# Patient Record
Sex: Female | Born: 1960 | Race: White | Hispanic: No | Marital: Married | Smoking: Never smoker
Health system: Southern US, Community
[De-identification: ages and names within clinical notes are randomized; demographics above are authoritative.]

## PROBLEM LIST (undated history)

## (undated) DIAGNOSIS — J45990 Exercise induced bronchospasm: Secondary | ICD-10-CM

## (undated) DIAGNOSIS — J302 Other seasonal allergic rhinitis: Secondary | ICD-10-CM

## (undated) DIAGNOSIS — E039 Hypothyroidism, unspecified: Secondary | ICD-10-CM

## (undated) DIAGNOSIS — Z862 Personal history of diseases of the blood and blood-forming organs and certain disorders involving the immune mechanism: Secondary | ICD-10-CM

## (undated) DIAGNOSIS — Z973 Presence of spectacles and contact lenses: Secondary | ICD-10-CM

## (undated) HISTORY — PX: WISDOM TOOTH EXTRACTION: SHX21

## (undated) HISTORY — PX: COLONOSCOPY: SHX174

---

## 1998-03-16 ENCOUNTER — Other Ambulatory Visit: Admission: RE | Admit: 1998-03-16 | Discharge: 1998-03-16 | Payer: Self-pay | Admitting: *Deleted

## 1999-04-17 ENCOUNTER — Other Ambulatory Visit: Admission: RE | Admit: 1999-04-17 | Discharge: 1999-04-17 | Payer: Self-pay | Admitting: *Deleted

## 2000-05-27 ENCOUNTER — Other Ambulatory Visit: Admission: RE | Admit: 2000-05-27 | Discharge: 2000-05-27 | Payer: Self-pay | Admitting: *Deleted

## 2001-08-19 ENCOUNTER — Inpatient Hospital Stay (HOSPITAL_COMMUNITY): Admission: RE | Admit: 2001-08-19 | Discharge: 2001-08-22 | Payer: Self-pay | Admitting: Hematology and Oncology

## 2001-08-20 ENCOUNTER — Encounter: Payer: Self-pay | Admitting: Hematology and Oncology

## 2001-08-21 ENCOUNTER — Encounter: Payer: Self-pay | Admitting: Hematology and Oncology

## 2001-11-17 ENCOUNTER — Other Ambulatory Visit: Admission: RE | Admit: 2001-11-17 | Discharge: 2001-11-17 | Payer: Self-pay | Admitting: *Deleted

## 2002-10-30 ENCOUNTER — Emergency Department (HOSPITAL_COMMUNITY): Admission: EM | Admit: 2002-10-30 | Discharge: 2002-10-30 | Payer: Self-pay | Admitting: Emergency Medicine

## 2002-11-25 ENCOUNTER — Other Ambulatory Visit: Admission: RE | Admit: 2002-11-25 | Discharge: 2002-11-25 | Payer: Self-pay | Admitting: Obstetrics and Gynecology

## 2004-01-03 ENCOUNTER — Other Ambulatory Visit: Admission: RE | Admit: 2004-01-03 | Discharge: 2004-01-03 | Payer: Self-pay | Admitting: Obstetrics and Gynecology

## 2005-03-02 ENCOUNTER — Other Ambulatory Visit: Admission: RE | Admit: 2005-03-02 | Discharge: 2005-03-02 | Payer: Self-pay | Admitting: Obstetrics and Gynecology

## 2005-06-21 ENCOUNTER — Emergency Department (HOSPITAL_COMMUNITY): Admission: EM | Admit: 2005-06-21 | Discharge: 2005-06-21 | Payer: Self-pay | Admitting: Emergency Medicine

## 2005-06-25 ENCOUNTER — Ambulatory Visit (HOSPITAL_COMMUNITY): Admission: RE | Admit: 2005-06-25 | Discharge: 2005-06-25 | Payer: Self-pay | Admitting: Orthopedic Surgery

## 2005-12-24 DIAGNOSIS — Z862 Personal history of diseases of the blood and blood-forming organs and certain disorders involving the immune mechanism: Secondary | ICD-10-CM

## 2005-12-24 HISTORY — DX: Personal history of diseases of the blood and blood-forming organs and certain disorders involving the immune mechanism: Z86.2

## 2006-03-19 ENCOUNTER — Other Ambulatory Visit: Admission: RE | Admit: 2006-03-19 | Discharge: 2006-03-19 | Payer: Self-pay | Admitting: Obstetrics and Gynecology

## 2006-06-13 ENCOUNTER — Encounter: Admission: RE | Admit: 2006-06-13 | Discharge: 2006-07-23 | Payer: Self-pay | Admitting: Family Medicine

## 2006-12-24 HISTORY — PX: TOTAL THYROIDECTOMY: SHX2547

## 2007-02-10 ENCOUNTER — Encounter: Admission: RE | Admit: 2007-02-10 | Discharge: 2007-05-11 | Payer: Self-pay | Admitting: Family Medicine

## 2007-02-21 ENCOUNTER — Ambulatory Visit (HOSPITAL_COMMUNITY): Admission: RE | Admit: 2007-02-21 | Discharge: 2007-02-21 | Payer: Self-pay | Admitting: Family Medicine

## 2007-02-26 ENCOUNTER — Encounter (INDEPENDENT_AMBULATORY_CARE_PROVIDER_SITE_OTHER): Payer: Self-pay | Admitting: Specialist

## 2007-02-26 ENCOUNTER — Other Ambulatory Visit: Admission: RE | Admit: 2007-02-26 | Discharge: 2007-02-26 | Payer: Self-pay | Admitting: Interventional Radiology

## 2007-02-26 ENCOUNTER — Encounter: Admission: RE | Admit: 2007-02-26 | Discharge: 2007-02-26 | Payer: Self-pay | Admitting: Family Medicine

## 2007-05-01 ENCOUNTER — Encounter: Admission: RE | Admit: 2007-05-01 | Discharge: 2007-05-01 | Payer: Self-pay | Admitting: Obstetrics and Gynecology

## 2007-05-21 ENCOUNTER — Encounter (HOSPITAL_COMMUNITY): Admission: RE | Admit: 2007-05-21 | Discharge: 2007-05-21 | Payer: Self-pay | Admitting: Endocrinology

## 2007-08-14 ENCOUNTER — Encounter (INDEPENDENT_AMBULATORY_CARE_PROVIDER_SITE_OTHER): Payer: Self-pay | Admitting: Surgery

## 2007-08-14 ENCOUNTER — Ambulatory Visit (HOSPITAL_COMMUNITY): Admission: RE | Admit: 2007-08-14 | Discharge: 2007-08-15 | Payer: Self-pay | Admitting: Surgery

## 2007-12-29 ENCOUNTER — Encounter: Admission: RE | Admit: 2007-12-29 | Discharge: 2007-12-29 | Payer: Self-pay | Admitting: Obstetrics and Gynecology

## 2009-11-22 ENCOUNTER — Ambulatory Visit: Payer: Self-pay | Admitting: Sports Medicine

## 2009-11-22 DIAGNOSIS — M25569 Pain in unspecified knee: Secondary | ICD-10-CM | POA: Insufficient documentation

## 2009-11-22 DIAGNOSIS — M79609 Pain in unspecified limb: Secondary | ICD-10-CM | POA: Insufficient documentation

## 2009-11-22 DIAGNOSIS — M25559 Pain in unspecified hip: Secondary | ICD-10-CM | POA: Insufficient documentation

## 2010-08-24 ENCOUNTER — Ambulatory Visit: Payer: Self-pay | Admitting: Sports Medicine

## 2010-08-24 DIAGNOSIS — M7750 Other enthesopathy of unspecified foot: Secondary | ICD-10-CM | POA: Insufficient documentation

## 2010-08-24 DIAGNOSIS — M7660 Achilles tendinitis, unspecified leg: Secondary | ICD-10-CM | POA: Insufficient documentation

## 2010-09-21 ENCOUNTER — Ambulatory Visit: Payer: Self-pay | Admitting: Sports Medicine

## 2010-11-22 ENCOUNTER — Ambulatory Visit: Payer: Self-pay | Admitting: Sports Medicine

## 2010-11-22 DIAGNOSIS — Q667 Congenital pes cavus, unspecified foot: Secondary | ICD-10-CM | POA: Insufficient documentation

## 2010-12-12 ENCOUNTER — Ambulatory Visit: Payer: Self-pay | Admitting: Sports Medicine

## 2010-12-24 HISTORY — PX: ORIF WRIST FRACTURE: SHX2133

## 2010-12-24 HISTORY — PX: REPAIR PERONEAL TENDONS ANKLE: SUR1201

## 2011-01-14 ENCOUNTER — Encounter: Payer: Self-pay | Admitting: Family Medicine

## 2011-01-25 NOTE — Assessment & Plan Note (Signed)
Summary: F/U FEET,MC   Vital Signs:  Patient profile:   50 year old female Pulse rate:   76 / minute BP sitting:   123 / 86  (right arm)  Vitals Entered By: Rochele Pages RN (November 22, 2010 10:01 AM) CC: f/u foot pain   CC:  f/u foot pain.  History of Present Illness: Pt returns to clinic for f/u rt achilles tendonitis, and foot pain.   Achilles tendonitis is 70% improved- she stopped her rehab exercises and running 1 month ago d/t foot pain. Has not used ketoprofen in 1 month.  Feels some discomfort in achilles after running. Has new left lateral foot pain that started after a run where she landed unsteadily 3-4 times.  Also reports pain on the bottoms of her feet- worst at night.  Feels better with shoes on. Left heel pain is the worst. Was up to 20 miles per week before ankle injury last month.    Preventive Screening-Counseling & Management  Alcohol-Tobacco     Smoking Status: never  Allergies (verified): 1)  ! Amoxicillin  Social History: Smoking Status:  never  Physical Exam  General:  Well-developed,well-nourished,in no acute distress; alert,appropriate and cooperative throughout examination Msk:  RT ankle shows no swelling; stable lateral and medial ligaments; squeeze test and kleiger test unremarkable; talar dome seems nontender; no sign of peroneal tendon subluxations; no pain at base of 5th MT. Lt ankle is normal with exception of swelling around peroneal tendons coming into base of the 5th. This is mild   Strength of peroneals on left normal, calf normal. Bilateral cavus feet with supintaion on standing.  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)  RT appears healed  new injury to left that I think comes from running in dark and loss of proprioception  ankle exercises w emphasis on balance restart super feet  if still w  foot aching we need to move ahead to custom orthotics with her supination and cavus feet  reck in 4 to 6 wks if not improved  Orders: Theraband per yard [id number])  Problem # 2:  ACHILLES TENDINITIS (ICD-726.71) clinically this appears healed at this point  OK to resume easly ruinning and build back up if no probs  Problem # 3:  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 Aaa tid  Patient Instructions: 1)  Ok to gradually start running again 2)  Use superfeet in running shoes    Orders Added: 1)  Est. Patient Level III [84132] 2)  Theraband per yard [[id number]]

## 2011-01-25 NOTE — Assessment & Plan Note (Signed)
Summary: RT ANKLE INJURY X 1 MONTH,MC   Vital Signs:  Patient profile:   50 year old female Height:      62 inches Weight:      138 pounds BMI:     25.33 BP sitting:   133 / 78  Vitals Entered By: Darene Lamer MD (August 24, 2010 11:11 AM)  History of Present Illness: 50yo female to office w/ c/o R heel/achilles pain x 55-month. Running 18-miles/wk on road with many hills.  Currently increasing mileage about 10-15% a week.  Running with husband who is training for 1/2 marathon.   Recently changed shoes, but the more she ran had increasing pain in back of heel/ankle & at times in lateral & medial ankle. Some associated swelling. Denies any numbness/tingling. Not taking any meds for this. Using ice wraps after work-outs & occasionaly night time icing. Doing some rowing, elliptical, & wt training.  Job: Works at [employer] for [employer]  Hx of bad Rt ankle sprain several years ago that required use of a boot.  Review of Systems      See HPI  Physical Exam  General:  Well-developed,well-nourished,in no acute distress; alert,appropriate and cooperative throughout examination Msk:  ANKLE:  - Rt ankle: mild amount of lateral swelling noted over peroneal tendons.  Minimal tenderness with deep palpation over peroneal muscles posterior to lateral malleolus.  No TTP over medial or lateral malleolus, no TTP over base of 5th MT.  Normal ankle ROM without pain.  Strength +5/5.  Neg anterior drawer, neg talar tilt, neg Kleiger.  No peroneal subluxations. - Lt ankle with FROM without pain, swelling, tenderness, weakness, instability.  FOOT:  - Rt foot: no swelling noted.  (+)TTP at insertion of calcaneous, no palpable nodules or step offs.  Normal gastroc definition with neg Thompson test.  Able to heel raise.  No tenderness over PF.  Does have mild forefoot collapse.  Rt foot with slight pronation at rest. - Lt foot: no swelling, deformity, tenderness.  No significant arch  breakdown.    GAIT: has increased pronation of right foot with walking compared to left. Pulses:  2/4 DP & PT b/l Extremities:  no edema Neurologic:  sensation intact to light touch.   Additional Exam:  MSK U/S: Rt achilles with no signs of tear.  Increased fluid noted along calcaneous at insertion of achilles.  No increased blood flow noted with doppler.  Rt achilles thickness measuring 0.44cm.  Comparison to Lt achilles which measured 0.37cm.  Exam of Rt lateral ankle revealed fluid surrounding peroneal muscles at level of peroneal tubercle, best seen in transverse.  No signs of tear.  No increased blood flow on doppler.  Images saved.   Impression & Recommendations:  Problem # 1:  ACHILLES TENDINITIS (ICD-726.71) Assessment New  - Rt achilles tendinitis as noted on MSK u/s - Rx for Ketaprofen gel given - apply tid - Set of heel lifts given for shoes - Reviewed stretches & exercises - handout given for eccentric heel raises, toe walking, heel walking, & stretches.  Should do these exercises twice daily. - Ok to continue with running, should avoid hills.  If having persistant pain should cut back time/mileage. 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 - f/u 4-weeks for re-evaluation.  Orders: [place] LIMITED ([id number]) Foot Insert (Q5956)  Complete Medication List: 1)  Ketoprofen Gel 20%  .... Aaa tid  Patient Instructions: 1)  You have some achilles & peroneal tendonitis. 2)  Apply Ketoprofen gel to affected areas 3-times a day. 3)  Do stretches & exercises twice daily as shown in the office. 4)  Put heel lifts in shoes to ease pressure on achilles. 5)  Continue running, but should avoid hills  when possible. 6)  Follow-up in 72-month for re-evaluation. Prescriptions: KETOPROFEN GEL 20% aaa tid  #60 gms x 3   Entered by:   Lillia Pauls CMA   Authorized by:   Darene Lamer MD   Signed by:   Lillia Pauls CMA on 08/24/2010   Method used:   Print then Give to Patient   RxID:   [id number]

## 2011-01-25 NOTE — Assessment & Plan Note (Signed)
Summary: f/u,mc   Vital Signs:  Patient profile:   50 year old female Pulse rate:   73 / minute BP sitting:   120 / 76  (right arm) CC: f/u rt achilles/heel pain   CC:  f/u rt achilles/heel pain.  History of Present Illness: 50yo female for f/u of R achilles & R peroneal tendonitis. Achilles feel 75% better.  No pain with running.  No swelling. Peroneals feel about 50% better, still bothersome at times, but not while running.  No swelling, no bruising. Using ketoprofen as needed. Doing exercises at least once daily. Heel lifts feel good in her shoes. Running 15-18 miles/week at this time.  Review of Systems      See HPI  Physical Exam  General:  Well-developed,well-nourished,in no acute distress; alert,appropriate and cooperative throughout examination Msk:  ANKLE:  - Rt ankle: no swelling noted over over peroneal tendons today.  Minimal tenderness with deep palpation over insertion of peroneus brevis. No TTP posterior to lateral malleolus.  No TTP over medial malleolus.Normal ankle ROM without pain.  Strength +5/5.  Neg anterior drawer, neg talar tilt, neg Kleiger.  No peroneal subluxations. - Lt ankle with FROM without pain, swelling, tenderness, weakness, instability.  FOOT:  - Rt foot: no swelling noted.  Minimal tenderness of achilles at at insertion of calcaneous, no palpable nodules or step offs.  Normal gastroc definition with neg Thompson test.  Able to heel raise.  No tenderness over PF.  Mild forefoot collapse.  Rt foot with slight pronation at rest. - Lt foot: no swelling, deformity, tenderness.  No significant arch breakdown.    GAIT: has increased pronation of right foot with walking compared to left.  No limp  Neurovascularly intact   Impression & Recommendations:  Problem # 1:  ACHILLES TENDINITIS (ICD-726.71) Assessment Improved - 75% improved - Continue to wear heel lifts - should use these for at least 3 months - Continue achilles exercises daily -  Cont. Ketoprofen gel as needed - Should ice after workouts if having pain - Ok to continue running, may increase mileage 2-3 miles/wk as long as not having pain.  Start to add a hill workout once a week - 5-6 runs of 75-100 yds up a hill. 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 ketoprofen gel as needed - Ice after workouts if having pain - See running plan as stated above - f/u 6 weeks  Complete Medication List: 1)  Ketoprofen Gel 20%  .... Aaa tid  Patient Instructions: 1)  Continue to wear heel lifts, should wear for at least 58-months. 2)  Continue to use ketoprofen gel as needed. 3)  Continue achilles stretches and exercises daily. 4)  Continue running, may increase mileage 2-3 miles/wk if not having pain.  Start adding hill workout once a week - 5-6 runs of 75-100yds up a hill. 5)  Follow-up 6 weeks for re-evaluation.

## 2011-01-25 NOTE — Assessment & Plan Note (Signed)
Summary: TO SEE FIELDS & Karuna Balducci,ORTHOTICS,MC   Vital Signs:  Patient profile:   50 year old female BP sitting:   120 / 76  Vitals Entered By: Lillia Pauls CMA (December 12, 2010 3:21 PM)  CC:  f/u foot pain and custom orthotics.  History of Present Illness: 50yo female to office for custom orthotics. Achilles continue to feel good. Still having foot pain on bottoms of feet that is worst at end of the day.  Pain worse on left compared to right.  Denies numbness/tingling. Continues to have some left lateral ankle pain, but improved.  Is doing exercises as directed. Has tried using treadmill, but this has been painful.  Allergies: 1)  ! Amoxicillin PMH-FH-SH reviewed for relevance  Review of Systems      See HPI  Physical Exam  General:  Well-developed,well-nourished,in no acute distress; alert,appropriate and cooperative throughout examination Msk:  ANKLES: - RT ankle: no swelling; stable lateral and medial ligaments; squeeze test and kleiger test unremarkable; talar dome nontender; no peroneal tendon subluxations; no pain at base of 5th MT. - LT ankle: mildly TTP along insertion of peroneal tendons at base of the 5th, no swelling noted.  Normal peroneal strength.  No laxity.  No tenderness.  FEET: Bilateral cavus feet with supintaion while standing on standing.  GAIT: Running gait shows slight supination b/l.  No limp. Pulses:  +2/4 DP & PT b/l Neurologic:  sensation intact to light touch.     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 Patient was fitted for a : standard, cushioned, semi-rigid orthotic. The orthotic was heated and afterward the patient stood on the orthotic blank positioned on the orthotic stand. The patient was positioned in subtalar neutral position and 10 degrees of ankle dorsiflexion in a weight bearing stance. After completion of molding, a stable  base was applied to the orthotic blank. The blank was ground to a stable position for weight bearing. Size:  8 blue swirl Base: blue EVA foam Posting: none Additional orthotic padding:  none Comfortable to patient in office today. 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  Orders: [employer], each unit [phone])  Complete Medication List: 1)  Ketoprofen Gel 20%  .... Aaa tid   Orders Added: 1)  Est. Patient Level IV [60454] 2)  Orthotic Materials, each unit [L3002]

## 2011-02-15 ENCOUNTER — Ambulatory Visit (INDEPENDENT_AMBULATORY_CARE_PROVIDER_SITE_OTHER): Payer: Commercial Managed Care - PPO | Admitting: Sports Medicine

## 2011-02-15 ENCOUNTER — Encounter: Payer: Self-pay | Admitting: Sports Medicine

## 2011-02-15 DIAGNOSIS — M7750 Other enthesopathy of unspecified foot: Secondary | ICD-10-CM

## 2011-02-20 NOTE — Assessment & Plan Note (Signed)
Summary: L ANKLE F/U,MC   History of Present Illness: back in fall had started back running had another ankle sprain on left late sept and a couple of injuries since then has not gotten back to running icing no real exercises swells a lot after too much walking  RT Achilles hurt back in fall and improved  works a number of 12 hour shifts both feet ache at night  Allergies: 1)  ! Amoxicillin  Physical Exam  General:  Well-developed,well-nourished,in no acute distress; alert,appropriate and cooperative throughout examination Msk:  Left foot shows swelling post to lat malleolus  stable lateral and medial ligaments; squeeze test and kleiger test unremarkable; talar dome seems nontender; no sign of peroneal tendon subluxations; no pain at base of 5th MT.  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 Aaa tid 2)  Nitroglycerin 0.2 Mg/hr Pt24 (Nitroglycerin) .... 1/4 patch as directed x 24 hours per day Prescriptions: NITROGLYCERIN 0.2 MG/HR PT24 (NITROGLYCERIN) 1/4 patch as directed x 24 hours per day  #30 x 1   Entered and Authorized by:   Enid Baas MD   Signed by:   Enid Baas MD on 02/15/2011  Method used:   Electronically to        [facility] (retail)       [address]       [place], [place]  [id number]       Ph: [phone]       Fax: [phone]   RxID:   [id number]    Orders Added: 1)  Airsport brace [L1906] 2)  Est. Patient Level IV [36644] 3)  [place] LIMITED [03474]

## 2011-03-15 ENCOUNTER — Ambulatory Visit (INDEPENDENT_AMBULATORY_CARE_PROVIDER_SITE_OTHER): Payer: Commercial Managed Care - PPO | Admitting: Sports Medicine

## 2011-03-15 ENCOUNTER — Encounter: Payer: Self-pay | Admitting: Sports Medicine

## 2011-03-15 VITALS — BP 100/68 | Ht 62.0 in | Wt 150.0 lb

## 2011-03-15 DIAGNOSIS — M7750 Other enthesopathy of unspecified foot: Secondary | ICD-10-CM

## 2011-03-15 NOTE — Progress Notes (Signed)
  Subjective:    Patient ID: [name], female    DOB: 1961/04/23, 50 y.o.   MRN: [id number]  HPI 49yo female to office for f/u of left peroneal tendon tear.  Symptoms relatively unchanged from last visit.  Using NTG as directed, no adverse side effects, has not seen much improvement.  Continues to wear airsport brace.  Has stopped stationary bike & yoga b/c uncomfortable.  Continues to use custom orthotics which are comfortable.  Denies any numbness/tingling.    Review of Systems Per HPI, otherwise negative   BP 100/68  Ht 5\' 2"  (1.575 m)  Wt 150 lb (68.04 kg)  BMI 27.44 kg/m2  Objective:   Physical Exam GEN: AOx3, NAD MSK:  Left foot/ankle shows mild amount of swelling posterior to lateral malleolus.  TTP along peroneal tendons, no pain at base of 5th MT.  Good ROM of ankle, some pain with eversion of the foot.  Good strength in the ankle, mild pain with resisted eversion.  No TTP over achilles or PF.  Walking with antalgic gait.  Neurovascularly intact distally  MSK U/S:  Left ankle- the left peroneus brevis tendon has a persistent split just behind and below the lat malleolus, but appears to have decreased in size.  Seen both on long & short views.  This normalizes distal to malleolus.  Increased doppler flow noted, but less than previous visit.  Minimal fluid surrounding tendon sheath today.  Images saved.      Assessment & Plan:   Problem List as of 03/15/2011                     Other enthesopathy of ankle and tarsus   Last Assessment & Plan Note   03/15/2011 Office Visit Addendum 03/15/2011  6:23 PM by Claris Che, MD    - Improvement on MSK u/s today with decreased splitting & decreased neovessels - Cont. airsport brace - Cont. NTG - Should try to resume stationary bike if able. - f/u 4-weeks to reassess progress, call with any questions/concerns

## 2011-03-15 NOTE — Assessment & Plan Note (Addendum)
-   Improvement on MSK u/s today with decreased splitting & decreased neovessels - Cont. airsport brace - Cont. NTG - Should try to resume stationary bike if able. - f/u 4-weeks to reassess progress, call with any questions/concerns

## 2011-04-12 ENCOUNTER — Encounter: Payer: Self-pay | Admitting: Sports Medicine

## 2011-04-12 ENCOUNTER — Ambulatory Visit (INDEPENDENT_AMBULATORY_CARE_PROVIDER_SITE_OTHER): Payer: Commercial Managed Care - PPO | Admitting: Sports Medicine

## 2011-04-12 VITALS — BP 118/80

## 2011-04-12 DIAGNOSIS — M7750 Other enthesopathy of unspecified foot: Secondary | ICD-10-CM

## 2011-04-12 NOTE — Progress Notes (Signed)
  Subjective:    Patient ID: [name], female    DOB: 02-03-1961, 50 y.o.   MRN: [id number]  HPI 49yo female to office for f/u of left peroneal tendon tear. She feels 40% improved at this time. Notices decreased swelling. Is having some pain and soreness at the end of the day. Continues to wear her airsport brace at all times. Has been doing some light walking and occasional stationary bike. She continues to use nitroglycerin patch without any side effects. Denies any numbness or tingling.   Review of Systems    per HPI, otherwise negative Objective:   Physical Exam GEN: AOx3, NAD  MSK: Left foot/ankle shows no significant soft tissue swelling today.   Mild tenderness to palpation along the peroneal tendons just posterior to the lateral malleolus. Has mild pain with resisted ankle eversion, otherwise  Full-strength and full range of motion.  No TTP over achilles or PF.  Continues to walk with antalgic gait, although improved   neurovascularly intact distally.  MSK U/S: Left ankle- the left peroneus brevis tendon has a persistent split just behind and below the lat malleolus.   Splint appears to have decreased in size and has decreased surrounding edema. Noted to have increased Doppler flow in this area compared to previous scans. Overall it does appear improved. Images.        Assessment & Plan:

## 2011-04-12 NOTE — Assessment & Plan Note (Signed)
-   Persistent tearing of peroneal tendons noted on MSK ultrasound today, has noticeably increased Doppler flow - Cont. airsport brace  - Cont. NTG  -  okay to slowly start increasing activity such as walking and stationary bike as long as not having pain. - f/u 6-weeks to reassess progress, call with any questions/concerns

## 2011-04-16 ENCOUNTER — Encounter: Payer: Self-pay | Admitting: *Deleted

## 2011-04-26 ENCOUNTER — Telehealth: Payer: Self-pay | Admitting: *Deleted

## 2011-04-26 NOTE — Telephone Encounter (Signed)
The patient did not want to wait until May 31 appointment here and have to eventually go and see an orthopedic doctor.  she feels going to see an orthopedic specialist now might save some steps in the process.  I have scheduled the patient to see Dr. Marcene Corning for Wednesday, May 9 at 4 PM to have him evaluate and determine if surgery is needed. Their phone number is [phone]. I informed the patient of this appointment and she is in total agreement with it.

## 2011-05-08 NOTE — Op Note (Signed)
NAME:  [name], [name]                ACCOUNT NO.:  [id number]   MEDICAL RECORD NO.:  [id number]          PATIENT TYPE:  OIB   LOCATION:  1336                         FACILITY:  [facility]   PHYSICIAN:  Velora Heckler, MD      DATE OF BIRTH:  31-Aug-1961   DATE OF PROCEDURE:  08/14/2007  DATE OF DISCHARGE:                               OPERATIVE REPORT   PREOPERATIVE DIAGNOSIS:  Left thyroid nodule, hyperthyroidism.   POSTOPERATIVE DIAGNOSIS:  Left thyroid nodule, hyperthyroidism.   PROCEDURE:  Total thyroidectomy.   SURGEON:  Velora Heckler, MD, FACS   ASSISTANT:  Karie Soda, MD   ANESTHESIA:  General.   ESTIMATED BLOOD LOSS:  Minimal.   PREPARATION:  Betadine.   COMPLICATIONS:  None.   INDICATIONS:  The patient is 50 year old white female from [place],  [place].  She had a left thyroid nodule found is an incidental  finding during an MRI scan of the neck for cervical disk disease.  Workup included a thyroid ultrasound showing a heterogeneous thyroid  gland.  Fine-needle aspiration biopsy was performed and showed  follicular epithelium.  No atypia was identified.  Thyroid function  tests showed a low TSH level of 0.046.  The patient now comes to surgery  for thyroidectomy for management of thyroid nodule and hyperthyroidism.   BODY OF REPORT:  Procedure was done in OR #11 at the [facility].  The patient is brought to the operating room,  placed in supine position on the operating room table.  Following  administration of general anesthesia, the patient is positioned and then  prepped and draped in usual strict aseptic fashion.  After ascertaining  that an adequate level of anesthesia been obtained, a Kocher incision is  made with a #15 blade.  Dissection is carried through subcutaneous  tissues and platysma.  Hemostasis was obtained with electrocautery.  Skin flaps were elevated cephalad and caudad.  A [place]ner self-retaining  retractor was placed  for exposure.  Strap muscles were incised in the  midline and dissection is begun on the left side of the neck.  Left lobe  is exposed.  Lobe appears slightly firm consistent with underlying  thyroiditis.  There is a palpable nodule in the inferior pole.  Venous  tributaries were divided between small Ligaclips with the harmonic  scalpel.  Superior pole vessels were dissected out, ligated in  continuity with 2-0 silk ties, and divided with harmonic scalpel.  Superior parathyroid gland is identified and preserved on its vascular  pedicle.  Likewise an inferior parathyroid gland is identified and  preserved on its vascular pedicle.  Branches of the inferior thyroid  artery are divided between small Ligaclips with the harmonic scalpel  used for hemostasis.  Gland is rolled anteriorly and the ligament of  Allyson Sabal is transected with electrocautery.  Gland is excised off of the  anterior trachea and mobilized across the midline.  There is no  significant pyramidal lobe.   Next we turned our attention to the right side.  Again strap muscles  were reflected laterally and the right  thyroid lobe was exposed.  Superior pole vessels were dissected out, ligated in continuity with 2-0  silk ties, and divided with the harmonic scalpel.  Inferior venous  tributaries were divided between small Ligaclips with the harmonic  scalpel.  Gland is rolled anteriorly.  Again parathyroid tissue was  identified and preserved.  Recurrent nerve was identified and preserved.  Branches of the inferior thyroid artery are divided between small  Ligaclips with the harmonic scalpel.  Ligament of Allyson Sabal is transected  with electrocautery and the gland was further rolled anteriorly and  excised off the anterior trachea using the harmonic scalpel.  Sutures  used to mark the right superior pole of the gland and the gland is  submitted in its entirety to pathology for review.  Good hemostasis was  obtained in the neck.  Neck is  irrigated with warm saline.  Surgicel was  placed in the operative field.  Strap muscles were reapproximated in the  midline with interrupted 3-0 Vicryl sutures.  Platysma was closed with  interrupted 3-0 Vicryl sutures.  Skin was closed with running 4-0  Monocryl subcuticular suture.  Wound is washed and dried.  Benzoin,  Steri-Strips are applied.  Sterile dressings were applied.  The patient  is awakened from anesthesia and brought to the recovery room in stable  condition.  The patient tolerated the procedure well.      Velora Heckler, MD  Electronically Signed     TMG/MEDQ  D:  08/14/2007  T:  08/15/2007  Job:  [id number]   cc:   Dorisann Frames, M.D.  Fax: [phone]   Pam Drown, M.D.  Fax: [phone]

## 2011-05-11 NOTE — Discharge Summary (Signed)
[place]. [facility]  Patient:    [name], [name] Visit Number: [id number] MRN: [id number]          Service Type: MED Location: [id number] Attending Physician:  Janan Halter Dictated by:   Lowell C. Catha Gosselin, M.D. Admit Date:  08/19/2001 Discharge Date: 08/22/2001   CC:         Frederick A. Worthy Rancher, M.D.  Rockey Situ. Flavia Shipper., M.D.   Discharge Summary  DISCHARGE DIAGNOSIS:  Severe acute idiopathic thrombocytopenic purpura, improved.  HISTORY OF PRESENT ILLNESS:  The patient is a 50 year old female patient who was admitted with petechiae and a platelet count of essentially 0.  She had not had any bleeding prior to that but had noticed petechiae developing over the last couple of days prior to admission.  We subsequently admitted her due to her severely low platelet count for subsequent therapy.  Her past medical history had been otherwise not significant.  On physical exam temperature was 101.2 degrees, weight 148 pounds.  Vital signs were stable. Oral mucosa was within normal limits.  She did have multiple petechiae on her legs.  Abdomen was benign, without hepatosplenomegaly.  HOSPITAL COURSE:  The patient subsequently was seen by infectious disease due to the spectrum of symptoms that she had.  They suggested that CMV serologies be done, which are pending.  HIV serology was done and was negative.  They did not feel this represented [place] Nile virus.  Her admission laboratories here showed a white count of 8.9, hemoglobin 13.5, platelet count less than 5000. Sodium 139, creatinine 0.7.  Fibrinogen 367.  PT 13.4 seconds, PTT of 29 seconds, with no schistocytes on smear.  We subsequently started her on high-dose IV steroids and IV Amicar.  Her petechiae quickly started to fade.  She had a little gum bleeding on August 20, 2001.  She had a CT scan of the brain done on August 20, 2001, and had a slight dye reaction.  However, the CT did not  show any intracranial bleeding and was essentially normal.  We did an ultrasound of the abdomen and looked at her abdomen, and her spleen size was normal.  By August 21, 2001, her platelet count was 10,000.  By August 22, 2001, it was 12,000 with a white count of 8.4 and hemoglobin 11.5.  RBC folate level was 322, ferritin level was 62, B12 level was 1579.  We subsequently felt by that point in time that she could go home on p.o. medications.  DISCHARGE MEDICATIONS:  Prednisone 60 mg p.o. q.d.  FOLLOW-UP:  She was to follow up with me within a week.  ACTIVITY:  As tolerated.  DIET:  As tolerated.  DISCHARGE INSTRUCTIONS:  She was not to take aspirin or aspirin products.  CONDITION ON DISCHARGE:  At the time of discharge the overall status was improved.  PROGNOSIS:  Guarded. Dictated by:   Lowell C. Catha Gosselin, M.D. Attending Physician:  Janan Halter DD:  08/29/01 TD:  08/31/01 Job: [id number]

## 2011-05-11 NOTE — H&P (Signed)
[place]  Patient:    [name], [name] Visit Number: [id number] MRN: [id number]          Service Type: MED Location: [phone] [id number] Attending Physician:  Janan Halter Dictated by:   Lowell C. Catha Gosselin, M.D. Adm. Date:  08/19/2001   CC:         Frederick A. Worthy Rancher, M.D.  Dr. Burnice Logan, infectious disease.   History and Physical  DATE OF BIRTH:  06-Mar-1961  HISTORY OF PRESENT ILLNESS:  The patient is admitted today.  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 She actually has been feeling fairly well until having kind of a dull headache the last day or so. She also developed some petechiae on her lower extremities over the last couple of days. She went to Dr. Terri Piedra, who, today, drew a CBC based on the appearance of her rash from yesterday. Her creatinine was 0.9, BUN 13. White blood count 6.8, hemoglobin 13.7, MCV 86. The platelet count was essentially 0. ANA was done, but has not returned.  She really has not had any oral or nasal bleeding. No GI or GU bleeding. She had a fairly recent menstrual period that was actually shorter rather longer than normal. She is not having any vaginal spotting now. Her husband had a vasectomy so she does not feel as though she is probably pregnant.  Her last dental work was done two to three months ago and was uneventful.  ALLERGIES:  AMOXICILLIN, SHE HAS A RASH TO IT.  CURRENT MEDICATIONS AT HOME:  Glucosamine over-the-counter.  FAMILY HISTORY:  Negative for any blood disorders or cancer disorders that she knows of.  SOCIAL HISTORY:  She does not smoke. She has an occasional alcoholic beverage. She lives in [place]. She has two children. She is married and her husband comes with her today.  REVIEW OF SYSTEMS:  Otherwise, unremarkable. No B symptoms. She does not recall being bitten by any type of insect or  spider.  PHYSICAL EXAMINATION:  VITAL SIGNS:  Temperature 101.2. Weight 148 pounds. She is 5 feet 2 inches tall. Blood pressure 123/79, pulse 104, respiratory rate 20.  HEENT:  Oral mucosa is within normal limits.  NECK:  No cervical, supraclavicular or axillary adenopathy.  HEART:  S1 and S2 without rubs or gallops.  LUNGS:  Fairly clear.  ABDOMEN:  Benign without hepatosplenomegaly. Bowel sounds active. No inguinal adenopathy.  EXTREMITIES:  She does have petechiae on her legs. None on her hard or soft palate.  LABORATORY DATA:  Studies are being repeated in the hospital.  ASSESSMENT:  Severe thrombocytopenia. I told the patient I do not think this is leukemia since she has a normal hemoglobin and a normal white blood count. I do not think it is probably any of the current syndromes including West Nile virus, which everyone is concerned about, because that does not specifically cause a thrombocytopenia. I suspect this is a viral syndrome with secondary idiopathic thrombocytopenic purpura.  PLAN:  We are going to go ahead and admit her to the hospital and start her on some IV steroid therapy. If she fails that we can always do IBIG, but we would like to see her platelet count coming up prior to going home. We talked about splenectomy, as well, should she fail those other two modalities. For now I am going to hold off on a bone marrow test to  see if, indeed, treatment for ITP does not improve this fairly soon. Dictated by:   Lowell C. Catha Gosselin, M.D. Attending Physician:  Janan Halter DD:  08/19/01 TD:  08/19/01 Job: [id number]

## 2011-05-24 ENCOUNTER — Ambulatory Visit (INDEPENDENT_AMBULATORY_CARE_PROVIDER_SITE_OTHER): Payer: Commercial Managed Care - PPO | Admitting: Sports Medicine

## 2011-05-24 VITALS — BP 116/82

## 2011-05-24 DIAGNOSIS — M7750 Other enthesopathy of unspecified foot: Secondary | ICD-10-CM

## 2011-05-24 NOTE — Patient Instructions (Signed)
Use theraband for light ankle range of motion Balance on left foot  Ok to start walking short distances at a time Return for re-scan in 6 weeks

## 2011-05-24 NOTE — Progress Notes (Signed)
  Subjective:    Patient ID: [name], female    DOB: May 22, 1961, 50 y.o.   MRN: [id number]  HPI  Pt presents to clinic for f/u of peroneal tendon tear which she states is slightly less painful but still symptoms returned when she stands or spends too much time at work. The rigid ankle brace is also getting uncomfortable after using this for a significant period of time. .  She is interested in discussing surgery if u/s does not show improvement of tear.  Had shot of cortisone done by Dr. Yisroel Ramming while dr fields was away- only helped 24 hours by the next day pain was the same.  Increased walking causes significant pain. Able to do easy ROM without problems.    She feels that if she is showing some improvement on ultrasound she may want to continue treatment but if not we'll go ahead with the surgical evaluation. Review of Systems     Objective:   Physical Exam No acute distress    Lt ankle exam- No obvious swelling Ligaments stable in inversion and eversion Dorsi and plantar flexion strong Inversion and eversion strong Slight pain with eversion Strength on eversion testing seems normal  Musculoskeletal ultrasound Compared to last visit there are signs of improvement. There is less hypoechoic change around the tendon sheaths themselves. I still visualize an area just inferior to the tip of the malleolus in which it appears on both the longitudinal and transverse scan there is a partial split of the peroneal brevis tendon. This looks somewhat less prominent than on last scan. The biggest change is that on last examination she had marked increased Doppler activity with neo-vessels that crossed the entire tendon. On today's scan the Doppler activity is normal and there are minimal neo-vessels present in this area.   Assessment & Plan:

## 2011-05-24 NOTE — Assessment & Plan Note (Signed)
With the improvement seen on ultrasound I suggested that we be a bit more aggressive in her activity and rehabilitation. We switched her to an ASO brace. We gave her Theraband to start some easy range of motion and light resistance exercise as well as 1 foot balance exercises.  Since the studies of Achilles tendon showed that the first sign of healing was a change in Doppler activity for that tendon, I hope that this is a sign of healing of the peroneal tendons. I did suggest continued another 6 weeks of nitroglycerin therapy and rescanning at the end of that time. If she worsens or fails to improve at that point we will get her back to Dr. Jerl Santos for surgical consideration. Carbon copy this note to Dr. Jerl Santos.

## 2011-06-20 ENCOUNTER — Other Ambulatory Visit: Payer: Self-pay | Admitting: *Deleted

## 2011-06-20 MED ORDER — MELOXICAM 15 MG PO TABS: 15.0000 mg | ORAL_TABLET | Freq: Every day | ORAL | Status: AC | PRN

## 2011-06-20 NOTE — Progress Notes (Signed)
Pt called stating she was increasing activity since last visit including walking, using theraband and balancing exercises.  She was having soreness with this increased activity, but not significant pain.  Over the weekend she helped a neighbor move, and by the end of the day was having so much pain that it was painful to walk.  Advil not helpful.  Spoke with Dr. Darrick Penna- he states lifting aggravated the ankle and pt should rest the ankle over the next 3-4 days and ice as much as possible.  If not improving in 3-4 days she should return for evaluation.  Per Dr. Darrick Penna meloxicam rx'd for pt.

## 2011-06-22 ENCOUNTER — Encounter: Payer: Self-pay | Admitting: Sports Medicine

## 2011-06-22 ENCOUNTER — Other Ambulatory Visit: Payer: Self-pay | Admitting: *Deleted

## 2011-06-22 DIAGNOSIS — M7750 Other enthesopathy of unspecified foot: Secondary | ICD-10-CM

## 2011-07-05 ENCOUNTER — Ambulatory Visit: Payer: Commercial Managed Care - PPO | Admitting: Sports Medicine

## 2011-07-05 NOTE — Progress Notes (Unsigned)
  Subjective:    Patient ID: [name], female    DOB: 10-29-1961, 50 y.o.   MRN: [id number]  HPI    Review of Systems     Objective:   Physical Exam        Assessment & Plan:

## 2011-07-10 ENCOUNTER — Ambulatory Visit (HOSPITAL_BASED_OUTPATIENT_CLINIC_OR_DEPARTMENT_OTHER)
Admission: RE | Admit: 2011-07-10 | Discharge: 2011-07-10 | Disposition: A | Discharge status: Home / Self Care | Payer: Commercial Managed Care - PPO | Source: Ambulatory Visit | Attending: Orthopaedic Surgery | Admitting: Orthopaedic Surgery

## 2011-07-10 DIAGNOSIS — M24176 Other articular cartilage disorders, unspecified foot: Secondary | ICD-10-CM | POA: Insufficient documentation

## 2011-07-10 DIAGNOSIS — E669 Obesity, unspecified: Secondary | ICD-10-CM | POA: Insufficient documentation

## 2011-07-10 DIAGNOSIS — K219 Gastro-esophageal reflux disease without esophagitis: Secondary | ICD-10-CM | POA: Insufficient documentation

## 2011-07-10 DIAGNOSIS — Z01812 Encounter for preprocedural laboratory examination: Secondary | ICD-10-CM | POA: Insufficient documentation

## 2011-07-10 DIAGNOSIS — M249 Joint derangement, unspecified: Secondary | ICD-10-CM | POA: Insufficient documentation

## 2011-07-10 DIAGNOSIS — E039 Hypothyroidism, unspecified: Secondary | ICD-10-CM | POA: Insufficient documentation

## 2011-07-17 ENCOUNTER — Emergency Department (HOSPITAL_COMMUNITY)
Admission: EM | Admit: 2011-07-17 | Discharge: 2011-07-17 | Disposition: A | Discharge status: Home / Self Care | Payer: Commercial Managed Care - PPO | Attending: Emergency Medicine | Admitting: Emergency Medicine

## 2011-07-17 ENCOUNTER — Emergency Department (HOSPITAL_COMMUNITY): Payer: Commercial Managed Care - PPO

## 2011-07-17 DIAGNOSIS — Z9889 Other specified postprocedural states: Secondary | ICD-10-CM | POA: Insufficient documentation

## 2011-07-17 DIAGNOSIS — W010XXA Fall on same level from slipping, tripping and stumbling without subsequent striking against object, initial encounter: Secondary | ICD-10-CM | POA: Insufficient documentation

## 2011-07-17 DIAGNOSIS — E039 Hypothyroidism, unspecified: Secondary | ICD-10-CM | POA: Insufficient documentation

## 2011-07-17 DIAGNOSIS — S5290XA Unspecified fracture of unspecified forearm, initial encounter for closed fracture: Secondary | ICD-10-CM | POA: Insufficient documentation

## 2011-07-19 ENCOUNTER — Ambulatory Visit (HOSPITAL_BASED_OUTPATIENT_CLINIC_OR_DEPARTMENT_OTHER)
Admission: RE | Admit: 2011-07-19 | Discharge: 2011-07-19 | Disposition: A | Discharge status: Home / Self Care | Payer: Commercial Managed Care - PPO | Source: Ambulatory Visit | Attending: Orthopaedic Surgery | Admitting: Orthopaedic Surgery

## 2011-07-19 DIAGNOSIS — S52599A Other fractures of lower end of unspecified radius, initial encounter for closed fracture: Secondary | ICD-10-CM | POA: Insufficient documentation

## 2011-07-19 DIAGNOSIS — W19XXXA Unspecified fall, initial encounter: Secondary | ICD-10-CM | POA: Insufficient documentation

## 2011-07-19 DIAGNOSIS — Z01812 Encounter for preprocedural laboratory examination: Secondary | ICD-10-CM | POA: Insufficient documentation

## 2011-07-19 LAB — POCT HEMOGLOBIN-HEMACUE: Hemoglobin: 11.5 g/dL — ABNORMAL LOW (ref 12.0–15.0)

## 2011-08-08 NOTE — Op Note (Signed)
[name], [name]                ACCOUNT NO.:  [id number]  MEDICAL RECORD NO.:  [id number]  LOCATION:                                 FACILITY:  PHYSICIAN:  Lubertha Basque. Naija Troost, M.D.DATE OF BIRTH:  September 24, 1961  DATE OF PROCEDURE:  07/19/2011 DATE OF DISCHARGE:                              OPERATIVE REPORT   PREOPERATIVE DIAGNOSIS:  Right distal radius fracture.  POSTOPERATIVE DIAGNOSIS:  Right distal radius fracture.  PROCEDURE:  ORIF, right distal radius fracture.  ANESTHESIA:  General.  ATTENDING SURGEON:  Lubertha Basque. Jerl Santos, MD  ASSISTANT:  Lindwood Qua, PA   INDICATIONS FOR PROCEDURE:  The patient is a 50 year old woman who fell and broke her wrist a few days ago.  She is actually recuperating from some ankle surgery done last week.  She presented to the ER, then to the office with displaced intraarticular fracture, and is offered ORIF in hopes of realigning her joint.  Informed operative consent was obtained after discussion of possible complications including reaction to anesthesia, infection, neurovascular injury, and stiffness.  SUMMARY FINDINGS AND PROCEDURE:  Under general anesthesia through a volar approach, a right distal radius fracture was exposed, reduced, and stabilized with a short standard DVR plate.  I used fluoroscopy throughout the case to make appropriate intraoperative decisions and read all these views myself.  Bryna Colander assisted throughout and was invaluable to the completion of the case in that he helped maintain reduction while I performed the procedure.  He also closed simultaneously to help minimize OR time.  DESCRIPTION OF PROCEDURE:  The patient was taken to the operating suite where general anesthetic was applied without difficulty.  She was positioned in supine and prepped and draped in normal sterile fashion. After administration of IV Kefzol, the arm was elevated, exsanguinated, and tourniquet applied about the upper arm.  A  volar incision was made with dissection down to the FCR tendon.  We took this in a radial direction, went through the base of this tendon bed.  The short muscles were reflected off the radius.  The fracture site was well visualized. Reduction was performed of all the fragments under fluoroscopic guidance.  We then placed a short standard DVR plate utilizing all the screw holes.  I used fluoroscopy several times during application of plate to make sure that was in the proper position.  The wound was irrigated followed by reapproximation of deep tissues with 2-0 undyed Vicryl.  Skin was closed.  The tourniquet was deflated and fingers became pink and warm immediately.  I then closed skin with nylon.  Some Marcaine was injected followed by Adaptic, dry gauze, and a volar splint of plaster with wrist in slight extension.  We also removed the splint from her left leg and removed the sutures there and placed some Steri- Strips.  The __________ was reapplied.  Estimated blood loss and fluids obtained from anesthesia records as can accurate tourniquet time.  DISPOSITION:  The patient was extubated in the operating room and taken to recovery room in stable addition.  She was to go home the same day and follow up in the office in less than a week.  I will contact  her by phone tonight.     Lubertha Basque Jerl Santos, M.D.     PGD/MEDQ  D:  07/19/2011  T:  07/20/2011  Job:  [id number]  Electronically Signed by Marcene Corning M.D. on 08/08/2011 08:44:27 PM

## 2011-08-08 NOTE — Op Note (Signed)
[name], [name]                ACCOUNT NO.:  [id number]  MEDICAL RECORD NO.:  [id number]  LOCATION:                                 FACILITY:  PHYSICIAN:  Lubertha Basque. Jerl Santos, M.D.     DATE OF BIRTH:  DATE OF PROCEDURE:  07/10/2011 DATE OF DISCHARGE:                              OPERATIVE REPORT   PREOPERATIVE DIAGNOSIS:  Left ankle peroneal tendon tear.  POSTOPERATIVE DIAGNOSIS:  Left ankle peroneal tendon tear.  PROCEDURE:  Left ankle peroneal tendon repair.  ANESTHESIA:  General and block.  ATTENDING SURGEON:  Lubertha Basque. Jerl Santos, MD  ASSISTANT:  Lindwood Qua, PA   INDICATIONS FOR PROCEDURE:  The patient is a 50 year old woman with an extensive history of lateral aspect left ankle pain.  This has persisted despite oral anti-inflammatories and rest as well as physical therapy. An injection along the peroneal tendon sheath did afford her transient relief.  By ultrasound study, she has a split tear of the peroneus brevis.  At this point, she is offered exploration of the peroneal tendons with repair versus transfer.  Informed operative consent was obtained after discussion of possible complications including reaction to anesthesia, infection, neurovascular injury, and peroneal tendon dislocation.  SUMMARY OF FINDINGS AND PROCEDURE:  Under general anesthesia and a block through a posterolateral incision, the peroneal tendons were examined. The peroneus brevis did have a split tear consistent with her ultrasound study.  This extended from the inferior aspect of the lateral malleolus about 5 or 6 cm proximally.  The tissues were fairly good and we elected to excise some devitalized tissues and then perform a repair rather than a transfer of the tendon.  This was done and she was closed primarily and discharged home.  DESCRIPTION OF PROCEDURE:  The patient was taken to the operating suite where general anesthetic was applied without difficulty.  She was also given a  block in the preanesthesia area.  She was positioned in supine with a bump under her left hip.  She was then prepped and draped in normal sterile fashion.  After administration of IV Kefzol, the left leg was elevated, exsanguinated, and tourniquet inflated about the upper calf.  A posterolateral incision was made just behind the lateral malleolus.  Dissection was carried down to the peroneal tendon sheath. The sheath was incised longitudinally along with the retinaculum.  The tendons were dislocated.  The peroneus longus appeared completely benign while the brevis did have a split tear as listed above.  We removed some devitalized tissue sharply with a knife and debulked the tendon somewhat.  We then repaired circumferentially with 3-0 Vicryl in a running fashion both anterior and posterior on the tendon.  This seemed to give [place] a nice repair.  The tendons were placed back into their appropriate positions.  The wound was irrigated followed by release of tourniquet.  A small amount of bleeding was easily controlled with some pressure.  We then repaired the retinaculum and some other portions of the peroneal tendon sheath with 2-0 undyed Vicryl in an interrupted fashion.  The tendons actually stayed behind the lateral malleolus even before this repair and certainly also stayed after the repair.  We then repaired the subcutaneous tissues with 2-0 undyed Vicryl and skin with nylon.  Adaptic was placed over the wound followed by dry gauze and a posterior splint of plaster with an ankle in roughly neutral position. Estimated blood loss and intraoperative fluids obtained from anesthesia records as can accurate tourniquet time.  DISPOSITION:  The patient was extubated in the operating room and taken to the recovery in stable addition.  She was to go home same-day and follow up in the office next week.  I will contact her by phone tonight.     Lubertha Basque Jerl Santos, M.D.     PGD/MEDQ  D:   07/10/2011  T:  07/11/2011  Job:  [id number]  Electronically Signed by Marcene Corning M.D. on 08/08/2011 08:43:38 PM

## 2011-09-07 ENCOUNTER — Other Ambulatory Visit: Payer: Self-pay | Admitting: Obstetrics and Gynecology

## 2011-09-07 DIAGNOSIS — R928 Other abnormal and inconclusive findings on diagnostic imaging of breast: Secondary | ICD-10-CM

## 2011-09-17 ENCOUNTER — Ambulatory Visit
Admission: RE | Admit: 2011-09-17 | Discharge: 2011-09-17 | Disposition: A | Discharge status: Home / Self Care | Payer: BC Managed Care – PPO | Source: Ambulatory Visit | Attending: Obstetrics and Gynecology | Admitting: Obstetrics and Gynecology

## 2011-09-17 DIAGNOSIS — R928 Other abnormal and inconclusive findings on diagnostic imaging of breast: Secondary | ICD-10-CM

## 2011-10-05 LAB — URINALYSIS, ROUTINE W REFLEX MICROSCOPIC
Bilirubin Urine: NEGATIVE
Hgb urine dipstick: NEGATIVE
Specific Gravity, Urine: 1.008
pH: 6

## 2011-10-05 LAB — BASIC METABOLIC PANEL
BUN: 11
Chloride: 110
Glucose, Bld: 96
Potassium: 4.3
Sodium: 143

## 2011-10-05 LAB — DIFFERENTIAL
Basophils Absolute: 0
Eosinophils Relative: 2
Lymphocytes Relative: 15
Monocytes Absolute: 0.4
Monocytes Relative: 6
Neutro Abs: 5.4

## 2011-10-05 LAB — CALCIUM
Calcium: 8.4
Calcium: 8.4

## 2011-10-05 LAB — CBC
HCT: 40.1
Hemoglobin: 13.8
MCHC: 34.5
RBC: 4.46
RDW: 13.3

## 2012-01-07 ENCOUNTER — Ambulatory Visit: Payer: BC Managed Care – PPO | Admitting: Sports Medicine

## 2012-01-09 ENCOUNTER — Ambulatory Visit (INDEPENDENT_AMBULATORY_CARE_PROVIDER_SITE_OTHER): Payer: BC Managed Care – PPO | Admitting: Sports Medicine

## 2012-01-09 VITALS — BP 120/74

## 2012-01-09 DIAGNOSIS — M7742 Metatarsalgia, left foot: Secondary | ICD-10-CM | POA: Insufficient documentation

## 2012-01-09 DIAGNOSIS — M7750 Other enthesopathy of unspecified foot: Secondary | ICD-10-CM

## 2012-01-09 NOTE — Patient Instructions (Signed)
Great to see you [name],  Balance drills on one foot. Come back to see [place] as needed!   [name]. Benjamin Stain, M.D. [facility] [address] [place] [address] [phone]

## 2012-01-09 NOTE — Assessment & Plan Note (Signed)
Breakdown trans arch. MT cookies b/l B/l lateral posting. RTC prn

## 2012-01-09 NOTE — Progress Notes (Signed)
  Subjective:    Patient ID: [name], female    DOB: Apr 24, 1961, 51 y.o.   MRN: [id number]  HPI [name] comes to see [place] after having repair of her peroneal tendons of the left. She comes in with complaints of pain on the toes in her left foot as well as under the metatarsal heads. She does do a lot of time on her feet, and has been wearing her orthotics. She also occasionally gets pain along the lateral aspect of both feet. She recently went to see her therapist and podiatrist, and they both told her that she over supinates.   Review of Systems    No fevers, chills, night sweats, weight loss, chest pain, or shortness of breath.  Objective:   Physical Exam  General:  Well developed, well nourished, and in no acute distress. Neuro:  Alert and oriented x3, extra-ocular muscles intact. Skin: Warm and dry, no rashes noted. Musculoskeletal: Breakdown of the transverse arch bilaterally. Cavus foot bilaterally. Tender to palpation under the metatarsal heads, with abnormal callus along the second through fourth metatarsal heads particularly on the left side. Well-healed surgical scar along the left ankle.     Assessment & Plan:   Foot pain: Symptoms suggestive of metatarsalgia. Metatarsal cookies placed bilaterally in her orthotics, lateral post also placed. These are comfortable to her. She will come back to see [place] on an as-needed basis.

## 2012-07-06 ENCOUNTER — Ambulatory Visit: Payer: BC Managed Care – PPO

## 2012-07-06 ENCOUNTER — Ambulatory Visit: Payer: BC Managed Care – PPO | Admitting: Family Medicine

## 2012-07-06 VITALS — BP 115/74 | HR 81 | Temp 98.7°F | Resp 18 | Ht 61.5 in | Wt 167.0 lb

## 2012-07-06 DIAGNOSIS — M79676 Pain in unspecified toe(s): Secondary | ICD-10-CM

## 2012-07-06 DIAGNOSIS — M79609 Pain in unspecified limb: Secondary | ICD-10-CM

## 2012-07-06 NOTE — Progress Notes (Signed)
  Subjective:    Patient ID: [name], female    DOB: Jan 11, 1961, 51 y.o.   MRN: [id number]  HPI [name] is a 51 y.o. female Stubbed 4th toe on ottoman yesterday.  Sore swollen, black and blue yesterday.    Tx: ice, advil, elevation.   Review of Systems     Objective:   Physical Exam  Constitutional: She appears well-developed and well-nourished.  HENT:  Head: Normocephalic and atraumatic.  Cardiovascular: Normal rate and regular rhythm.   Pulmonary/Chest: Effort normal.  Musculoskeletal:       Right foot: She exhibits tenderness and swelling.       Feet:  Neurological: She is alert. No sensory deficit.  Psychiatric: She has a normal mood and affect. Her behavior is normal.    [facility] reading (PRIMARY) by  Dr. Neva Seat: R 4th toe. Small nondisplaced fx at base of R 4th proximal phalynx.      Assessment & Plan:  [name] is a 51 y.o. female 1. Toe pain  DG Toe 4th Right   Suspected nondisplaced R 4th toe fx.  Buddy tape, hard soled shoe for next 3-4 weeks.  Cross training ok, but would avoid running for now.   rtc precautions.

## 2012-07-06 NOTE — Patient Instructions (Signed)
Buddy tape and wear hard soled shoe for next 3-4 weeks.   Recheck if any worsening. Toe Fracture Your caregiver has diagnosed you as having a fractured toe. A toe fracture is a break in the bone of a toe. "Buddy taping" is a way of splinting your broken toe, by taping the broken toe to the toe next to it. This "buddy taping" will keep the injured toe from moving beyond normal range of motion. Buddy taping also helps the toe heal in a more normal alignment. It may take 6 to 8 weeks for the toe injury to heal. HOME CARE INSTRUCTIONS   Leave your toes taped together for as long as directed by your caregiver or until you see a doctor for a follow-up examination. You can change the tape after bathing. Always use a small piece of gauze or cotton between the toes when taping them together. This will help the skin stay dry and prevent infection.   Apply ice to the injury for 15 to 20 minutes each hour while awake for the first 2 days. Put the ice in a plastic bag and place a towel between the bag of ice and your skin.   After the first 2 days, apply heat to the injured area. Use heat for the next 2 to 3 days. Place a heating pad on the foot or soak the foot in warm water as directed by your caregiver.   Keep your foot elevated as much as possible to lessen swelling.   Wear sturdy, supportive shoes. The shoes should not pinch the toes or fit tightly against the toes.   Your caregiver may prescribe a rigid shoe if your foot is very swollen.   Your may be given crutches if the pain is too great and it hurts too much to walk.   Only take over-the-counter or prescription medicines for pain, discomfort, or fever as directed by your caregiver.   If your caregiver has given you a follow-up appointment, it is very important to keep that appointment. Not keeping the appointment could result in a chronic or permanent injury, pain, and disability. If there is any problem keeping the appointment, you must call back  to this facility for assistance.  SEEK MEDICAL CARE IF:   You have increased pain or swelling, not relieved with medications.   The pain does not get better after 1 week.   Your injured toe is cold when the others are warm.  SEEK IMMEDIATE MEDICAL CARE IF:   The toe becomes cold, numb, or white.   The toe becomes hot (inflamed) and red.  Document Released: 12/07/2000 Document Revised: 11/29/2011 Document Reviewed: 07/26/2008 [facility] Patient Information 2012 [place], [place].

## 2012-09-30 ENCOUNTER — Other Ambulatory Visit: Payer: Self-pay | Admitting: Sports Medicine

## 2012-10-02 ENCOUNTER — Other Ambulatory Visit: Payer: Self-pay | Admitting: Obstetrics and Gynecology

## 2013-03-26 ENCOUNTER — Other Ambulatory Visit: Payer: Self-pay | Admitting: Occupational Medicine

## 2013-03-26 ENCOUNTER — Ambulatory Visit: Payer: Self-pay

## 2013-03-26 DIAGNOSIS — R7612 Nonspecific reaction to cell mediated immunity measurement of gamma interferon antigen response without active tuberculosis: Secondary | ICD-10-CM

## 2013-08-24 DIAGNOSIS — Z860100 Personal history of colon polyps, unspecified: Secondary | ICD-10-CM | POA: Insufficient documentation

## 2013-08-25 ENCOUNTER — Ambulatory Visit (INDEPENDENT_AMBULATORY_CARE_PROVIDER_SITE_OTHER): Payer: 59 | Admitting: Sports Medicine

## 2013-08-25 VITALS — BP 109/76 | Ht 62.0 in | Wt 160.0 lb

## 2013-08-25 DIAGNOSIS — M25561 Pain in right knee: Secondary | ICD-10-CM

## 2013-08-25 DIAGNOSIS — M25569 Pain in unspecified knee: Secondary | ICD-10-CM | POA: Insufficient documentation

## 2013-08-25 NOTE — Progress Notes (Signed)
  Subjective:    Patient ID: [name], female    DOB: October 10, 1961, 52 y.o.   MRN: [id number]  HPI  Pt presents to clinic for rt knee pain 1.5 month- no injury. One week ago was on her knees getting something under her desk and twisted her knee, had immediate pain and swelling. Initially pain was below patella, then around posterior knee. She has avoided activity over the past week.  This felt somewhat locked so that she could not extend the knee During the night she was able to fully extend it and since that time has been less painful  No Other acute injury     Review of Systems     Objective:   Physical Exam  No acute distress  Knee: Normal to inspection with no erythema or effusion or obvious bony abnormalities. Palpation normal with no warmth or joint line tenderness or patellar tenderness or condyle tenderness. ROM normal in flexion and extension  and lower leg rotation. Ligaments with solid consistent endpoints including ACL, PCL, LCL, MCL. + Mcmurray's for pain and + Thessaly provocative meniscal tests. Non painful patellar compression. Patellar and quadriceps tendons unremarkable. Hamstring and quadriceps strength is normal.  Ultrasound  patellar and quadriceps tendons are normal Medial meniscus is normal No effusion Right posterior meniscus laterally shows hypoechoic change       Assessment & Plan:

## 2013-08-25 NOTE — Patient Instructions (Addendum)
Use compression sleeve as tolerated when you are active, please do not sleep in sleeve  Please use stationary bike to rehab your knee or suggested knee exercises  Please follow up in 1 month  Thank you for seeing [place] today!

## 2013-08-25 NOTE — Assessment & Plan Note (Signed)
I think this is related to degenerative meniscus injury to the posterior lateral meniscus of the right knee  We will give a home exercise program  Keep using her orthotics  Avoid too much walking or running over the next month  Use knee compression sleeve  Recheck one month

## 2013-09-30 ENCOUNTER — Ambulatory Visit (INDEPENDENT_AMBULATORY_CARE_PROVIDER_SITE_OTHER): Payer: 59 | Admitting: Sports Medicine

## 2013-09-30 ENCOUNTER — Encounter: Payer: Self-pay | Admitting: Sports Medicine

## 2013-09-30 VITALS — BP 110/73 | HR 80 | Ht 62.0 in | Wt 160.0 lb

## 2013-09-30 DIAGNOSIS — Z5189 Encounter for other specified aftercare: Secondary | ICD-10-CM

## 2013-09-30 DIAGNOSIS — S83206D Unspecified tear of unspecified meniscus, current injury, right knee, subsequent encounter: Secondary | ICD-10-CM

## 2013-09-30 DIAGNOSIS — M25579 Pain in unspecified ankle and joints of unspecified foot: Secondary | ICD-10-CM

## 2013-09-30 NOTE — Progress Notes (Signed)
CC: Followup right knee pain and bilateral foot pain HPI: Patient is a very pleasant 52 year old female who presents for followup today. We saw her one month ago for acute right knee pain and swelling after a twisting injury to her knee. She was felt to have a degenerative meniscal tear at that time. Today, she states she is improving greatly. She is at least 50% better. She has been avoiding stairs and only doing her quad exercises every day. She is also using the body helix compression sleeve which she finds helpful. She does have some continued intermittent pain but this is largely improved. The swelling has also improved. Sometimes she does still get some popping in her knee and feels like something moves inside the knee joint. She denies any locking, catching or giving out.  With regards to her foot pain, she has had a long history of foot pain. Pain is on the lateral aspects of the feet and occasionally on the dorsum of the foot. She has no pain as long as she wears her orthotics in her tennis shoes but is unable to wear any other shoes. This is somewhat frustrating for her.  ROS: As above in the HPI. All other systems are stable or negative.  OBJECTIVE: APPEARANCE:  Patient in no acute distress.The patient appeared well nourished and normally developed. HEENT: No scleral icterus. Conjunctiva non-injected Resp: Non labored Skin: No rash MSK:  Right Knee - Inspection normal with no erythema or effusion or obvious bony abnormalities.  - Palpation normal with no warmth or joint line tenderness. No TTP at patellar or quad tendon.  - ROM normal in flexion and extension. - Strength 5/5 in flexion and extension. - Ligaments with solid consistent endpoints including ACL, PCL, LCL, MCL.  - Negative Mcmurray's.   - Neurovascularly intact  Bilateral feet - Collapse of lateral column - Loss of transverse arch with splaying of the toes   ASSESSMENT: #1. Degenerative meniscal tear of right  knee, improving #2. 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 For degenerative meniscal tear, patient is gradually improving. We're pleased with her progress. She should continue her quad exercises and try to add in some stationary bicycle. She may start to do some walking for exercise if this does not cause her any pain. We cautioned her about stairs but she may start doing these again if it causes her no pain or swelling. Continue knee compression sleeve during the day. Followup in 2 months for repeat ultrasound. #2. For her bilateral foot pain, unfortunately she does have changes in her foot form that makes her predisposed to pain and require correction. We did ask her to bring her dress shoes by at some point if she would like [place] to see if dress orthotics might be an option. She does have significant relief of her symptoms when she does wear her custom orthotics.

## 2013-09-30 NOTE — Patient Instructions (Signed)
Thank you for coming in today  Continue your quad exercises and compression Okay to start back into some walking for exercise Avoid stairs if painful Followup in 2 months  For feet, we would be happy to look at your shoes to see if dress orthotics might work.

## 2013-10-09 ENCOUNTER — Other Ambulatory Visit: Payer: Self-pay | Admitting: Gastroenterology

## 2013-10-28 ENCOUNTER — Other Ambulatory Visit: Payer: Self-pay | Admitting: Obstetrics and Gynecology

## 2015-01-20 ENCOUNTER — Other Ambulatory Visit: Payer: Self-pay | Admitting: Obstetrics and Gynecology

## 2015-01-21 LAB — CYTOLOGY - PAP

## 2015-01-24 HISTORY — PX: BREAST BIOPSY: SHX20

## 2015-01-25 ENCOUNTER — Other Ambulatory Visit: Payer: Self-pay | Admitting: Obstetrics and Gynecology

## 2015-01-25 DIAGNOSIS — R928 Other abnormal and inconclusive findings on diagnostic imaging of breast: Secondary | ICD-10-CM

## 2015-02-03 ENCOUNTER — Ambulatory Visit
Admission: RE | Admit: 2015-02-03 | Discharge: 2015-02-03 | Disposition: A | Discharge status: Home / Self Care | Payer: BLUE CROSS/BLUE SHIELD | Source: Ambulatory Visit | Attending: Obstetrics and Gynecology | Admitting: Obstetrics and Gynecology

## 2015-02-03 ENCOUNTER — Other Ambulatory Visit: Payer: Self-pay | Admitting: Obstetrics and Gynecology

## 2015-02-03 DIAGNOSIS — R928 Other abnormal and inconclusive findings on diagnostic imaging of breast: Secondary | ICD-10-CM

## 2015-02-11 ENCOUNTER — Other Ambulatory Visit: Payer: Self-pay | Admitting: Obstetrics and Gynecology

## 2015-02-11 DIAGNOSIS — R928 Other abnormal and inconclusive findings on diagnostic imaging of breast: Secondary | ICD-10-CM

## 2015-02-15 ENCOUNTER — Ambulatory Visit
Admission: RE | Admit: 2015-02-15 | Discharge: 2015-02-15 | Disposition: A | Discharge status: Home / Self Care | Payer: BLUE CROSS/BLUE SHIELD | Source: Ambulatory Visit | Attending: Obstetrics and Gynecology | Admitting: Obstetrics and Gynecology

## 2015-02-15 ENCOUNTER — Ambulatory Visit
Admission: RE | Admit: 2015-02-15 | Discharge: 2015-02-15 | Disposition: A | Discharge status: Home / Self Care | Payer: Self-pay | Source: Ambulatory Visit | Attending: Obstetrics and Gynecology | Admitting: Obstetrics and Gynecology

## 2015-02-15 DIAGNOSIS — R928 Other abnormal and inconclusive findings on diagnostic imaging of breast: Secondary | ICD-10-CM

## 2015-03-11 ENCOUNTER — Other Ambulatory Visit: Payer: Self-pay | Admitting: General Surgery

## 2015-03-11 DIAGNOSIS — N6310 Unspecified lump in the right breast, unspecified quadrant: Secondary | ICD-10-CM

## 2015-03-14 ENCOUNTER — Other Ambulatory Visit: Payer: Self-pay | Admitting: General Surgery

## 2015-03-14 DIAGNOSIS — N6310 Unspecified lump in the right breast, unspecified quadrant: Secondary | ICD-10-CM

## 2015-03-15 ENCOUNTER — Other Ambulatory Visit: Payer: Self-pay | Admitting: General Surgery

## 2015-03-15 DIAGNOSIS — N6310 Unspecified lump in the right breast, unspecified quadrant: Secondary | ICD-10-CM

## 2015-03-22 ENCOUNTER — Encounter (HOSPITAL_BASED_OUTPATIENT_CLINIC_OR_DEPARTMENT_OTHER): Payer: Self-pay | Admitting: *Deleted

## 2015-03-22 NOTE — Progress Notes (Signed)
To come in for labs

## 2015-03-25 ENCOUNTER — Encounter (HOSPITAL_BASED_OUTPATIENT_CLINIC_OR_DEPARTMENT_OTHER)
Admission: RE | Admit: 2015-03-25 | Discharge: 2015-03-25 | Disposition: A | Discharge status: Home / Self Care | Payer: BLUE CROSS/BLUE SHIELD | Source: Ambulatory Visit | Attending: General Surgery | Admitting: General Surgery

## 2015-03-25 DIAGNOSIS — F419 Anxiety disorder, unspecified: Secondary | ICD-10-CM | POA: Diagnosis not present

## 2015-03-25 DIAGNOSIS — N6091 Unspecified benign mammary dysplasia of right breast: Secondary | ICD-10-CM | POA: Diagnosis not present

## 2015-03-25 DIAGNOSIS — Z91041 Radiographic dye allergy status: Secondary | ICD-10-CM | POA: Diagnosis not present

## 2015-03-25 DIAGNOSIS — N649 Disorder of breast, unspecified: Secondary | ICD-10-CM | POA: Diagnosis present

## 2015-03-25 DIAGNOSIS — E039 Hypothyroidism, unspecified: Secondary | ICD-10-CM | POA: Diagnosis not present

## 2015-03-25 DIAGNOSIS — Z86010 Personal history of colonic polyps: Secondary | ICD-10-CM | POA: Diagnosis not present

## 2015-03-25 DIAGNOSIS — N6021 Fibroadenosis of right breast: Secondary | ICD-10-CM | POA: Diagnosis not present

## 2015-03-25 DIAGNOSIS — Z881 Allergy status to other antibiotic agents status: Secondary | ICD-10-CM | POA: Diagnosis not present

## 2015-03-25 LAB — CBC WITH DIFFERENTIAL/PLATELET
BASOS ABS: 0.1 10*3/uL (ref 0.0–0.1)
BASOS PCT: 1 % (ref 0–1)
Eosinophils Absolute: 0.4 10*3/uL (ref 0.0–0.7)
Eosinophils Relative: 6 % — ABNORMAL HIGH (ref 0–5)
HCT: 37.2 % (ref 36.0–46.0)
Hemoglobin: 12.4 g/dL (ref 12.0–15.0)
Lymphocytes Relative: 21 % (ref 12–46)
Lymphs Abs: 1.5 10*3/uL (ref 0.7–4.0)
MCH: 30.6 pg (ref 26.0–34.0)
MCHC: 33.3 g/dL (ref 30.0–36.0)
MCV: 91.9 fL (ref 78.0–100.0)
MONOS PCT: 7 % (ref 3–12)
Monocytes Absolute: 0.5 10*3/uL (ref 0.1–1.0)
Neutro Abs: 4.6 10*3/uL (ref 1.7–7.7)
Neutrophils Relative %: 65 % (ref 43–77)
Platelets: 267 10*3/uL (ref 150–400)
RBC: 4.05 MIL/uL (ref 3.87–5.11)
RDW: 13.1 % (ref 11.5–15.5)
WBC: 7 10*3/uL (ref 4.0–10.5)

## 2015-03-25 LAB — BASIC METABOLIC PANEL
Anion gap: 5 (ref 5–15)
BUN: 10 mg/dL (ref 6–23)
CALCIUM: 9.2 mg/dL (ref 8.4–10.5)
CO2: 26 mmol/L (ref 19–32)
CREATININE: 0.77 mg/dL (ref 0.50–1.10)
Chloride: 106 mmol/L (ref 96–112)
GFR calc Af Amer: >90 mL/min (ref >90)
Glucose, Bld: 127 mg/dL — ABNORMAL HIGH (ref 70–99)
Potassium: 4.3 mmol/L (ref 3.5–5.1)
Sodium: 137 mmol/L (ref 135–145)

## 2015-03-28 ENCOUNTER — Ambulatory Visit
Admission: RE | Admit: 2015-03-28 | Discharge: 2015-03-28 | Disposition: A | Discharge status: Home / Self Care | Payer: BLUE CROSS/BLUE SHIELD | Source: Ambulatory Visit | Attending: General Surgery | Admitting: General Surgery

## 2015-03-28 DIAGNOSIS — N6310 Unspecified lump in the right breast, unspecified quadrant: Secondary | ICD-10-CM

## 2015-03-29 ENCOUNTER — Ambulatory Visit (HOSPITAL_BASED_OUTPATIENT_CLINIC_OR_DEPARTMENT_OTHER): Payer: BLUE CROSS/BLUE SHIELD | Admitting: Anesthesiology

## 2015-03-29 ENCOUNTER — Ambulatory Visit (HOSPITAL_BASED_OUTPATIENT_CLINIC_OR_DEPARTMENT_OTHER)
Admission: RE | Admit: 2015-03-29 | Discharge: 2015-03-29 | Disposition: A | Discharge status: Home / Self Care | Payer: BLUE CROSS/BLUE SHIELD | Source: Ambulatory Visit | Attending: General Surgery | Admitting: General Surgery

## 2015-03-29 ENCOUNTER — Ambulatory Visit
Admission: RE | Admit: 2015-03-29 | Discharge: 2015-03-29 | Disposition: A | Discharge status: Home / Self Care | Payer: BLUE CROSS/BLUE SHIELD | Source: Ambulatory Visit | Attending: General Surgery | Admitting: General Surgery

## 2015-03-29 ENCOUNTER — Encounter (HOSPITAL_BASED_OUTPATIENT_CLINIC_OR_DEPARTMENT_OTHER)
Admission: RE | Disposition: A | Discharge status: Home / Self Care | Payer: Self-pay | Source: Ambulatory Visit | Attending: General Surgery

## 2015-03-29 ENCOUNTER — Encounter (HOSPITAL_BASED_OUTPATIENT_CLINIC_OR_DEPARTMENT_OTHER): Payer: Self-pay | Admitting: *Deleted

## 2015-03-29 DIAGNOSIS — N6310 Unspecified lump in the right breast, unspecified quadrant: Secondary | ICD-10-CM

## 2015-03-29 DIAGNOSIS — Z881 Allergy status to other antibiotic agents status: Secondary | ICD-10-CM | POA: Insufficient documentation

## 2015-03-29 DIAGNOSIS — N6091 Unspecified benign mammary dysplasia of right breast: Secondary | ICD-10-CM | POA: Diagnosis not present

## 2015-03-29 DIAGNOSIS — E039 Hypothyroidism, unspecified: Secondary | ICD-10-CM | POA: Insufficient documentation

## 2015-03-29 DIAGNOSIS — F419 Anxiety disorder, unspecified: Secondary | ICD-10-CM | POA: Insufficient documentation

## 2015-03-29 DIAGNOSIS — Z86010 Personal history of colonic polyps: Secondary | ICD-10-CM | POA: Insufficient documentation

## 2015-03-29 DIAGNOSIS — N6021 Fibroadenosis of right breast: Secondary | ICD-10-CM | POA: Insufficient documentation

## 2015-03-29 DIAGNOSIS — Z91041 Radiographic dye allergy status: Secondary | ICD-10-CM | POA: Insufficient documentation

## 2015-03-29 HISTORY — DX: Other seasonal allergic rhinitis: J30.2

## 2015-03-29 HISTORY — DX: Hypothyroidism, unspecified: E03.9

## 2015-03-29 HISTORY — PX: BREAST EXCISIONAL BIOPSY: SUR124

## 2015-03-29 HISTORY — DX: Presence of spectacles and contact lenses: Z97.3

## 2015-03-29 HISTORY — DX: Exercise induced bronchospasm: J45.990

## 2015-03-29 HISTORY — DX: Personal history of diseases of the blood and blood-forming organs and certain disorders involving the immune mechanism: Z86.2

## 2015-03-29 HISTORY — PX: BREAST LUMPECTOMY WITH RADIOACTIVE SEED LOCALIZATION: SHX6424

## 2015-03-29 SURGERY — BREAST LUMPECTOMY WITH RADIOACTIVE SEED LOCALIZATION
Anesthesia: General | Site: Breast | Laterality: Right

## 2015-03-29 MED ORDER — BUPIVACAINE HCL (PF) 0.25 % IJ SOLN
INTRAMUSCULAR | Status: AC
  Filled 2015-03-29: qty 90

## 2015-03-29 MED ORDER — HYDROCODONE-ACETAMINOPHEN 5-325 MG PO TABS: 1.0000 | ORAL_TABLET | ORAL | Status: DC | PRN

## 2015-03-29 MED ORDER — LIDOCAINE HCL (CARDIAC) 20 MG/ML IV SOLN
INTRAVENOUS | Status: DC | PRN
  Administered 2015-03-29: 80 mg via INTRAVENOUS

## 2015-03-29 MED ORDER — OXYCODONE HCL 5 MG PO TABS
ORAL_TABLET | ORAL | Status: AC
  Filled 2015-03-29: qty 1

## 2015-03-29 MED ORDER — FENTANYL CITRATE 0.05 MG/ML IJ SOLN
INTRAMUSCULAR | Status: DC | PRN
  Administered 2015-03-29: 25 ug via INTRAVENOUS
  Administered 2015-03-29: 100 ug via INTRAVENOUS
  Administered 2015-03-29: 25 ug via INTRAVENOUS

## 2015-03-29 MED ORDER — HYDROMORPHONE HCL 1 MG/ML IJ SOLN
INTRAMUSCULAR | Status: AC
Start: 2015-03-29 — End: 2015-03-29
  Filled 2015-03-29: qty 1

## 2015-03-29 MED ORDER — BUPIVACAINE HCL (PF) 0.25 % IJ SOLN
INTRAMUSCULAR | Status: DC | PRN
  Administered 2015-03-29: 6 mL

## 2015-03-29 MED ORDER — ONDANSETRON HCL 4 MG/2ML IJ SOLN
INTRAMUSCULAR | Status: DC | PRN
  Administered 2015-03-29: 4 mg via INTRAVENOUS

## 2015-03-29 MED ORDER — MIDAZOLAM HCL 2 MG/2ML IJ SOLN: 1.0000 mg | INTRAMUSCULAR | Status: DC | PRN

## 2015-03-29 MED ORDER — CEFAZOLIN SODIUM-DEXTROSE 2-3 GM-% IV SOLR
INTRAVENOUS | Status: DC | PRN
  Administered 2015-03-29: 2 g via INTRAVENOUS

## 2015-03-29 MED ORDER — OXYCODONE HCL 5 MG PO TABS
5.0000 mg | ORAL_TABLET | Freq: Once | ORAL | Status: AC | PRN
  Administered 2015-03-29: 5 mg via ORAL

## 2015-03-29 MED ORDER — LACTATED RINGERS IV SOLN
INTRAVENOUS | Status: DC
  Administered 2015-03-29 (×2): via INTRAVENOUS

## 2015-03-29 MED ORDER — PROPOFOL 10 MG/ML IV BOLUS
INTRAVENOUS | Status: AC
  Filled 2015-03-29: qty 20

## 2015-03-29 MED ORDER — FENTANYL CITRATE 0.05 MG/ML IJ SOLN: 50.0000 ug | INTRAMUSCULAR | Status: DC | PRN

## 2015-03-29 MED ORDER — FENTANYL CITRATE 0.05 MG/ML IJ SOLN
INTRAMUSCULAR | Status: AC
  Filled 2015-03-29: qty 4

## 2015-03-29 MED ORDER — MIDAZOLAM HCL 2 MG/2ML IJ SOLN
INTRAMUSCULAR | Status: AC
  Filled 2015-03-29: qty 2

## 2015-03-29 MED ORDER — SCOPOLAMINE 1 MG/3DAYS TD PT72
MEDICATED_PATCH | TRANSDERMAL | Status: AC
  Filled 2015-03-29: qty 1

## 2015-03-29 MED ORDER — HYDROMORPHONE HCL 1 MG/ML IJ SOLN
0.2500 mg | INTRAMUSCULAR | Status: DC | PRN
  Administered 2015-03-29 (×2): 0.5 mg via INTRAVENOUS

## 2015-03-29 MED ORDER — PROMETHAZINE HCL 25 MG/ML IJ SOLN
6.2500 mg | INTRAMUSCULAR | Status: DC | PRN
Start: 2015-03-29 — End: 2015-03-29

## 2015-03-29 MED ORDER — PROPOFOL 10 MG/ML IV BOLUS
INTRAVENOUS | Status: DC | PRN
  Administered 2015-03-29: 200 mg via INTRAVENOUS

## 2015-03-29 MED ORDER — SCOPOLAMINE 1 MG/3DAYS TD PT72
1.0000 | MEDICATED_PATCH | Freq: Once | TRANSDERMAL | Status: DC
  Administered 2015-03-29: 1.5 mg via TRANSDERMAL

## 2015-03-29 MED ORDER — CEFAZOLIN SODIUM-DEXTROSE 2-3 GM-% IV SOLR
INTRAVENOUS | Status: AC
  Filled 2015-03-29: qty 50

## 2015-03-29 MED ORDER — DEXAMETHASONE SODIUM PHOSPHATE 4 MG/ML IJ SOLN
INTRAMUSCULAR | Status: DC | PRN
  Administered 2015-03-29: 10 mg via INTRAVENOUS

## 2015-03-29 MED ORDER — MIDAZOLAM HCL 5 MG/5ML IJ SOLN
INTRAMUSCULAR | Status: DC | PRN
  Administered 2015-03-29: 2 mg via INTRAVENOUS

## 2015-03-29 MED ORDER — OXYCODONE HCL 5 MG/5ML PO SOLN: 5.0000 mg | Freq: Once | ORAL | Status: AC | PRN

## 2015-03-29 SURGICAL SUPPLY — 56 items
APPLIER CLIP 9.375 MED OPEN (MISCELLANEOUS)
BENZOIN TINCTURE PRP APPL 2/3 (GAUZE/BANDAGES/DRESSINGS) IMPLANT
BINDER BREAST LRG (GAUZE/BANDAGES/DRESSINGS) IMPLANT
BINDER BREAST MEDIUM (GAUZE/BANDAGES/DRESSINGS) IMPLANT
BINDER BREAST XLRG (GAUZE/BANDAGES/DRESSINGS) ×2 IMPLANT
BINDER BREAST XXLRG (GAUZE/BANDAGES/DRESSINGS) IMPLANT
BLADE SURG 15 STRL LF DISP TIS (BLADE) ×1 IMPLANT
BLADE SURG 15 STRL SS (BLADE) ×1
CANISTER SUC SOCK COL 7IN (MISCELLANEOUS) IMPLANT
CANISTER SUCT 1200ML W/VALVE (MISCELLANEOUS) IMPLANT
CHLORAPREP W/TINT 26ML (MISCELLANEOUS) ×2 IMPLANT
CLIP APPLIE 9.375 MED OPEN (MISCELLANEOUS) IMPLANT
COVER BACK TABLE 60X90IN (DRAPES) ×2 IMPLANT
COVER MAYO STAND STRL (DRAPES) ×2 IMPLANT
COVER PROBE W GEL 5X96 (DRAPES) ×2 IMPLANT
DECANTER SPIKE VIAL GLASS SM (MISCELLANEOUS) ×2 IMPLANT
DEVICE DUBIN W/COMP PLATE 8390 (MISCELLANEOUS) ×2 IMPLANT
DRAPE LAPAROSCOPIC ABDOMINAL (DRAPES) ×2 IMPLANT
DRAPE UTILITY XL STRL (DRAPES) ×2 IMPLANT
DRSG TEGADERM 4X4.75 (GAUZE/BANDAGES/DRESSINGS) IMPLANT
ELECT COATED BLADE 2.86 ST (ELECTRODE) ×2 IMPLANT
ELECT REM PT RETURN 9FT ADLT (ELECTROSURGICAL) ×2
ELECTRODE REM PT RTRN 9FT ADLT (ELECTROSURGICAL) ×1 IMPLANT
GLOVE BIO SURGEON STRL SZ7 (GLOVE) ×4 IMPLANT
GLOVE BIO SURGEON STRL SZ7.5 (GLOVE) ×4 IMPLANT
GLOVE BIOGEL M 7.0 STRL (GLOVE) ×2 IMPLANT
GLOVE BIOGEL PI IND STRL 7.5 (GLOVE) ×2 IMPLANT
GLOVE BIOGEL PI INDICATOR 7.5 (GLOVE) ×2
GLOVE SURG SS PI 7.0 STRL IVOR (GLOVE) ×2 IMPLANT
GOWN STRL REUS W/ TWL LRG LVL3 (GOWN DISPOSABLE) ×3 IMPLANT
GOWN STRL REUS W/TWL LRG LVL3 (GOWN DISPOSABLE) ×3
KIT MARKER MARGIN INK (KITS) ×2 IMPLANT
LIQUID BAND (GAUZE/BANDAGES/DRESSINGS) ×2 IMPLANT
MARKER SKIN DUAL TIP RULER LAB (MISCELLANEOUS) ×2 IMPLANT
NEEDLE HYPO 25X1 1.5 SAFETY (NEEDLE) ×2 IMPLANT
NS IRRIG 1000ML POUR BTL (IV SOLUTION) IMPLANT
PACK BASIN DAY SURGERY FS (CUSTOM PROCEDURE TRAY) ×2 IMPLANT
PENCIL BUTTON HOLSTER BLD 10FT (ELECTRODE) ×2 IMPLANT
SLEEVE SCD COMPRESS KNEE MED (MISCELLANEOUS) ×2 IMPLANT
SPONGE GAUZE 4X4 12PLY STER LF (GAUZE/BANDAGES/DRESSINGS) IMPLANT
SPONGE LAP 4X18 X RAY DECT (DISPOSABLE) ×4 IMPLANT
STAPLER VISISTAT 35W (STAPLE) IMPLANT
STRIP CLOSURE SKIN 1/2X4 (GAUZE/BANDAGES/DRESSINGS) ×2 IMPLANT
SUT MNCRL AB 4-0 PS2 18 (SUTURE) ×2 IMPLANT
SUT MON AB 5-0 PS2 18 (SUTURE) IMPLANT
SUT SILK 2 0 SH (SUTURE) IMPLANT
SUT VIC AB 2-0 SH 27 (SUTURE) ×1
SUT VIC AB 2-0 SH 27XBRD (SUTURE) ×1 IMPLANT
SUT VIC AB 3-0 SH 27 (SUTURE) ×1
SUT VIC AB 3-0 SH 27X BRD (SUTURE) ×1 IMPLANT
SUT VIC AB 5-0 PS2 18 (SUTURE) ×2 IMPLANT
SYR CONTROL 10ML LL (SYRINGE) ×2 IMPLANT
TOWEL OR 17X24 6PK STRL BLUE (TOWEL DISPOSABLE) ×2 IMPLANT
TOWEL OR NON WOVEN STRL DISP B (DISPOSABLE) ×2 IMPLANT
TUBE CONNECTING 20X1/4 (TUBING) IMPLANT
YANKAUER SUCT BULB TIP NO VENT (SUCTIONS) IMPLANT

## 2015-03-29 NOTE — Op Note (Signed)
Preoperative diagnosis: Right breast radial scar on core biopsy with associated mass seen on mammography Postoperative diagnosis: Same as above Procedure: Right breast radioactive seed guided excisional biopsy Surgeon: Dr. Serita Grammes Anesthesia: Gen. Estimated blood loss: Minimal Specimens: Right breast tissue marked with paint Complications: None Drains: None Sponge and needle count was correct at the completion Disposition to recovery room in stable condition  Indications: This is a 54 year old healthy female who presents with a right breast lesion found on mammography. This underwent core biopsy and is a complex sclerosing lesion. She now discuss all of her options and elected to proceed with excision of the lesion. We discussed radioactive seed guidance that she had the radioactive seed placed prior to beginning.  Procedure: After informed consent was obtained the patient was taken to the operating room. She already had the seed in place. I had these mammograms in the operating room. She was given cefazolin. Sequential compression devices were on her legs. She was then placed under general anesthesia with an LMA. Her right breast was prepped and draped in the standard sterile surgical fashion. A surgical timeout was then performed.  I located the seed. I made a periareolar incision after infiltrating with local anesthetic. I then used the neoprobe to guide the excision of the seed in the surrounding tissue. The radioactive seed was in place at the end. There was no more radioactivity in the breast. Faxitron mammogram confirmed removal of the seed and the clip. This was confirmed by radiology. I then obtained hemostasis. I closed this with 2-0 Vicryl, 3-0 Vicryl, and 5-0 Monocryl. Glue and Steri-Strips were placed. A breast binder was placed. She tolerated this well and was extubated and transferred to the recovery room in stable condition.

## 2015-03-29 NOTE — H&P (Signed)
54 yof I know from [facility] presents after undergoing routine screening mm that has density c and was noted to have possible right breast distortion. She underwent diagnostic views with findings of a distortion in the lower outer right breast. US shows a 1.4x1.3 cm area of distortion 3 cm from nac at 7 oclock. US of the axilla is negative for adenopathy. core biopsy with clip placement was performed that shows a CSL. She has no complaints referable to either breast. She has no FH either.   Other Problems Elbert Ewings, CMA; 03/11/2015 9:37 AM) Anxiety Disorder Thyroid Disease  Past Surgical History Elbert Ewings, CMA; 03/11/2015 9:37 AM) Breast Biopsy Right. Colon Polyp Removal - Colonoscopy Foot Surgery Left. Thyroid Surgery  Diagnostic Studies History Elbert Ewings, [place]; 03/11/2015 9:37 AM) Colonoscopy 1-5 years ago Mammogram within last year Pap Smear 1-5 years ago  Allergies Elbert Ewings, CMA; 03/11/2015 9:38 AM) Amoxicillin ER *PENICILLINS* Contrast Media Ready-Box *MEDICAL DEVICES AND SUPPLIES*  Medication History Lars Mage Spillers, MA; 03/11/2015 2:50 PM) Synthroid (137MCG Tablet, Oral) Active. Levocetirizine Dihydrochloride (5MG  Tablet, Oral) Active. FLUoxetine HCl (20MG  Tablet, Oral) Active. Fish Oil Active. Vitamin D (1000UNIT Capsule, Oral) Active. Calcium Carbonate (250MG  Tablet Chewable, Oral) Active. Medications Reconciled  Social History Elbert Ewings, [place]; 03/11/2015 9:37 AM) Alcohol use Occasional alcohol use. Caffeine use Coffee. No drug use Tobacco use Never smoker.  Family History Elbert Ewings, [place]; 03/11/2015 9:37 AM) Alcohol Abuse Sister. Arthritis Mother. Heart Disease Father. Heart disease in female family member before age 64 Prostate Cancer Brother.  Pregnancy / Birth History Elbert Ewings, CMA; 03/11/2015 9:37 AM) Age at menarche 45 years. Contraceptive History Oral contraceptives. Gravida 2 Irregular periods Maternal age  54-30 Para 2  Review of Systems Elbert Ewings CMA; 03/11/2015 9:37 AM) General Not Present- Appetite Loss, Chills, Fatigue, Fever, Night Sweats, Weight Gain and Weight Loss. Skin Not Present- Change in Wart/Mole, Dryness, Hives, Jaundice, New Lesions, Non-Healing Wounds, Rash and Ulcer. HEENT Present- Seasonal Allergies. Not Present- Earache, Hearing Loss, Hoarseness, Nose Bleed, Oral Ulcers, Ringing in the Ears, Sinus Pain, Sore Throat, Visual Disturbances, Wears glasses/contact lenses and Yellow Eyes. Respiratory Not Present- Bloody sputum, Chronic Cough, Difficulty Breathing, Snoring and Wheezing. Breast Not Present- Breast Mass, Breast Pain, Nipple Discharge and Skin Changes. Cardiovascular Not Present- Chest Pain, Difficulty Breathing Lying Down, Leg Cramps, Palpitations, Rapid Heart Rate, Shortness of Breath and Swelling of Extremities. Gastrointestinal Not Present- Abdominal Pain, Bloating, Bloody Stool, Change in Bowel Habits, Chronic diarrhea, Constipation, Difficulty Swallowing, Excessive gas, Gets full quickly at meals, Hemorrhoids, Indigestion, Nausea, Rectal Pain and Vomiting. Female Genitourinary Present- Frequency and Urgency. Not Present- Nocturia, Painful Urination and Pelvic Pain. Musculoskeletal Not Present- Back Pain, Joint Pain, Joint Stiffness, Muscle Pain, Muscle Weakness and Swelling of Extremities. Neurological Not Present- Decreased Memory, Fainting, Headaches, Numbness, Seizures, Tingling, Tremor, Trouble walking and Weakness. Psychiatric Not Present- Anxiety, Bipolar, Change in Sleep Pattern, Depression, Fearful and Frequent crying. Endocrine Present- Hot flashes. Not Present- Cold Intolerance, Excessive Hunger, Hair Changes, Heat Intolerance and New Diabetes. Hematology Not Present- Easy Bruising, Excessive bleeding, Gland problems, HIV and Persistent Infections.   Vitals Elbert Ewings CMA; 03/11/2015 9:40 AM) 03/11/2015 9:40 AM Weight: 184 lb Height: 62in Body  Surface Area: 1.91 m Body Mass Index: 33.65 kg/m Temp.: 91F(Temporal)  Pulse: 97 (Regular)  Resp.: 16 (Unlabored)  BP: 128/70 (Sitting, Left Arm, Standard)    Physical Exam Rolm Bookbinder MD; 03/11/2015 3:38 PM) General Mental Status-Alert. Orientation-Oriented X3.  Chest and Lung Exam Chest and lung exam  reveals -on auscultation, normal breath sounds, no adventitious sounds and normal vocal resonance.  Breast Nipples-No Discharge. Breast Lump-No Palpable Breast Mass.  Cardiovascular Cardiovascular examination reveals -normal heart sounds, regular rate and rhythm with no murmurs.  Lymphatic Head & Neck  General Head & Neck Lymphatics: Bilateral - Description - Normal. Axillary  General Axillary Region: Bilateral - Description - Normal. Note: no [place] adenopathy     Assessment & Plan Rolm Bookbinder MD; 03/11/2015 3:39 PM) MASS OF RIGHT BREAST ON MAMMOGRAM (162.44  N63) Story: Right breast radioactive seed guided excisional biopsy We discussed indication with CSL on core. We discussed observation but I think better choice is excision in this healthy woman. Will plan for rs guided excisional biopsy.     Signed by Rolm Bookbinder, MD (03/11/2015 3:40 PM)

## 2015-03-29 NOTE — Interval H&P Note (Signed)
History and Physical Interval Note:  03/29/2015 10:43 AM  [name]  has presented today for surgery, with the diagnosis of RIGHT BREAST MASS  The various methods of treatment have been discussed with the patient and family. After consideration of risks, benefits and other options for treatment, the patient has consented to  Procedure(s): RIGHT BREAST LUMPECTOMY WITH RADIOACTIVE SEED LOCALIZATION (Right) as a surgical intervention .  The patient's history has been reviewed, patient examined, no change in status, stable for surgery.  I have reviewed the patient's chart and labs.  Questions were answered to the patient's satisfaction.     Chantil Bari

## 2015-03-29 NOTE — Anesthesia Procedure Notes (Signed)
Procedure Name: LMA Insertion Date/Time: 03/29/2015 11:10 AM Performed by: Maryella Shivers Pre-anesthesia Checklist: Patient identified, Emergency Drugs available, Suction available and Patient being monitored Patient Re-evaluated:Patient Re-evaluated prior to inductionOxygen Delivery Method: Circle System Utilized Preoxygenation: Pre-oxygenation with 100% oxygen Intubation Type: IV induction Ventilation: Mask ventilation without difficulty LMA: LMA inserted LMA Size: 4.0 Number of attempts: 1 Airway Equipment and Method: Bite block Placement Confirmation: positive ETCO2 Tube secured with: Tape Dental Injury: Teeth and Oropharynx as per pre-operative assessment

## 2015-03-29 NOTE — Anesthesia Postprocedure Evaluation (Signed)
  Anesthesia Post-op Note  Patient: [name]  Procedure(s) Performed: Procedure(s): RIGHT BREAST LUMPECTOMY WITH RADIOACTIVE SEED LOCALIZATION (Right)  Patient Location: PACU  Anesthesia Type:GA combined with regional for post-op pain  Level of Consciousness: awake and alert   Airway and Oxygen Therapy: Patient Spontanous Breathing  Post-op Pain: mild  Post-op Assessment: Post-op Vital signs reviewed  Post-op Vital Signs: stable  Last Vitals:  Filed Vitals:   03/29/15 1400  BP: 117/73  Pulse: 87  Temp: 36.7 C  Resp: 20    Complications: No apparent anesthesia complications

## 2015-03-29 NOTE — Transfer of Care (Signed)
Immediate Anesthesia Transfer of Care Note  Patient: IVIONA HOLE  Procedure(s) Performed: Procedure(s): RIGHT BREAST LUMPECTOMY WITH RADIOACTIVE SEED LOCALIZATION (Right)  Patient Location: PACU  Anesthesia Type:General  Level of Consciousness: sedated  Airway & Oxygen Therapy: Patient Spontanous Breathing and Patient connected to face mask oxygen  Post-op Assessment: Report given to RN and Post -op Vital signs reviewed and stable  Post vital signs: Reviewed and stable  Last Vitals:  Filed Vitals:   03/29/15 1005  BP: 142/83  Pulse: 83  Temp: 37.4 C  Resp: 18    Complications: No apparent anesthesia complications

## 2015-03-29 NOTE — Discharge Instructions (Signed)
[place] Office Phone Number [phone]  BREAST BIOPSY POST OP INSTRUCTIONS  Always review your discharge instruction sheet given to you by the facility where your surgery was performed.  IF YOU HAVE DISABILITY OR FAMILY LEAVE FORMS, YOU MUST BRING THEM TO THE OFFICE FOR PROCESSING.  DO NOT GIVE THEM TO YOUR DOCTOR.  1. A prescription for pain medication may be given to you upon discharge.  Take your pain medication as prescribed, if needed.  If narcotic pain medicine is not needed, then you may take acetaminophen (Tylenol), naprosyn (Alleve) or ibuprofen (Advil) as needed. 2. Take your usually prescribed medications unless otherwise directed 3. If you need a refill on your pain medication, please contact your pharmacy.  They will contact our office to request authorization.  Prescriptions will not be filled after 5pm or on week-ends. 4. You should eat very light the first 24 hours after surgery, such as soup, crackers, pudding, etc.  Resume your normal diet the day after surgery. 5. Most patients will experience some swelling and bruising in the breast.  Ice packs and a good support bra will help.  Wear the breast binder provided or a sports bra for 72 hours day and night.  After that wear a sports bra during the day until you return to the office. Swelling and bruising can take several days to resolve.  6. It is common to experience some constipation if taking pain medication after surgery.  Increasing fluid intake and taking a stool softener will usually help or prevent this problem from occurring.  A mild laxative (Milk of Magnesia or Miralax) should be taken according to package directions if there are no bowel movements after 48 hours. 7. Unless discharge instructions indicate otherwise, you may remove your bandages 48 hours after surgery and you may shower at that time.  You may have steri-strips (small skin tapes) in place directly over the incision.  These strips should  be left on the skin for 7-10 days and will come off on their own.  If your surgeon used skin glue on the incision, you may shower in 24 hours.  The glue will flake off over the next 2-3 weeks.  Any sutures or staples will be removed at the office during your follow-up visit. 8. ACTIVITIES:  You may resume regular daily activities (gradually increasing) beginning the next day.  Wearing a good support bra or sports bra minimizes pain and swelling.  You may have sexual intercourse when it is comfortable. a. You may drive when you no longer are taking prescription pain medication, you can comfortably wear a seatbelt, and you can safely maneuver your car and apply brakes. b. RETURN TO WORK:  ______________________________________________________________________________________ 9. You should see your doctor in the office for a follow-up appointment approximately two weeks after your surgery.  Your doctors nurse will typically make your follow-up appointment when she calls you with your pathology report.  Expect your pathology report 3-4 business days after your surgery.  You may call to check if you do not hear from [place] after three days. 10. OTHER INSTRUCTIONS: _______________________________________________________________________________________________ _____________________________________________________________________________________________________________________________________ _____________________________________________________________________________________________________________________________________ _____________________________________________________________________________________________________________________________________  WHEN TO CALL DR WAKEFIELD: 1. Fever over 101.0 2. Nausea and/or vomiting. 3. Extreme swelling or bruising. 4. Continued bleeding from incision. 5. Increased pain, redness, or drainage from the incision.  The clinic staff is available to answer your questions  during regular business hours.  Please dont hesitate to call and ask to speak to one of the nurses for clinical concerns.  If you have a medical emergency, go to the nearest emergency room or call 911.  A surgeon from [facility] is always on call at the hospital.  For further questions, please visit [email or web address]   Post Anesthesia Home Care Instructions  Activity: Get plenty of rest for the remainder of the day. A responsible adult should stay with you for 24 hours following the procedure.  For the next 24 hours, DO NOT: -Drive a car -Paediatric nurse -Drink alcoholic beverages -Take any medication unless instructed by your physician -Make any legal decisions or sign important papers.  Meals: Start with liquid foods such as gelatin or soup. Progress to regular foods as tolerated. Avoid greasy, spicy, heavy foods. If nausea and/or vomiting occur, drink only clear liquids until the nausea and/or vomiting subsides. Call your physician if vomiting continues.  Special Instructions/Symptoms: Your throat may feel dry or sore from the anesthesia or the breathing tube placed in your throat during surgery. If this causes discomfort, gargle with warm salt water. The discomfort should disappear within 24 hours.  If you had a scopolamine patch placed behind your ear for the management of post- operative nausea and/or vomiting:  1. The medication in the patch is effective for 72 hours, after which it should be removed.  Wrap patch in a tissue and discard in the trash. Wash hands thoroughly with soap and water. 2. You may remove the patch earlier than 72 hours if you experience unpleasant side effects which may include dry mouth, dizziness or visual disturbances. 3. Avoid touching the patch. Wash your hands with soap and water after contact with the patch.

## 2015-03-29 NOTE — Anesthesia Preprocedure Evaluation (Signed)
Anesthesia Evaluation  Patient identified by MRN, date of birth, ID band Patient awake    Reviewed: Allergy & Precautions, NPO status , Patient's Chart, lab work & pertinent test results  History of Anesthesia Complications Negative for: history of anesthetic complications  Airway Mallampati: I       Dental  (+) Teeth Intact   Pulmonary asthma ,  breath sounds clear to auscultation        Cardiovascular negative cardio ROS  Rhythm:Regular Rate:Normal     Neuro/Psych negative neurological ROS     GI/Hepatic negative GI ROS, Neg liver ROS,   Endo/Other  Hypothyroidism   Renal/GU negative Renal ROS     Musculoskeletal   Abdominal   Peds  Hematology negative hematology ROS (+)   Anesthesia Other Findings   Reproductive/Obstetrics                             Anesthesia Physical Anesthesia Plan  ASA: II  Anesthesia Plan: General   Post-op Pain Management:    Induction: Intravenous  Airway Management Planned: LMA  Additional Equipment:   Intra-op Plan:   Post-operative Plan: Extubation in OR  Informed Consent: I have reviewed the patients History and Physical, chart, labs and discussed the procedure including the risks, benefits and alternatives for the proposed anesthesia with the patient or authorized representative who has indicated his/her understanding and acceptance.     Plan Discussed with: CRNA and Surgeon  Anesthesia Plan Comments:         Anesthesia Quick Evaluation

## 2015-03-30 ENCOUNTER — Encounter (HOSPITAL_BASED_OUTPATIENT_CLINIC_OR_DEPARTMENT_OTHER): Payer: Self-pay | Admitting: General Surgery

## 2016-05-13 IMAGING — MG MM RT RADIOACTIVE SEED LOC MAMMO GUIDE
8 series · 8 of 8 positions shown · non-contrast
Comparison: Previous exam(s).

CLINICAL DATA: Biopsy proven radial scar in the right breast.
Surgical excision recommended.

EXAM:
MAMMOGRAPHIC GUIDED RADIOACTIVE SEED LOCALIZATION OF THE RIGHT
BREAST

[R LM (1 of 2)]
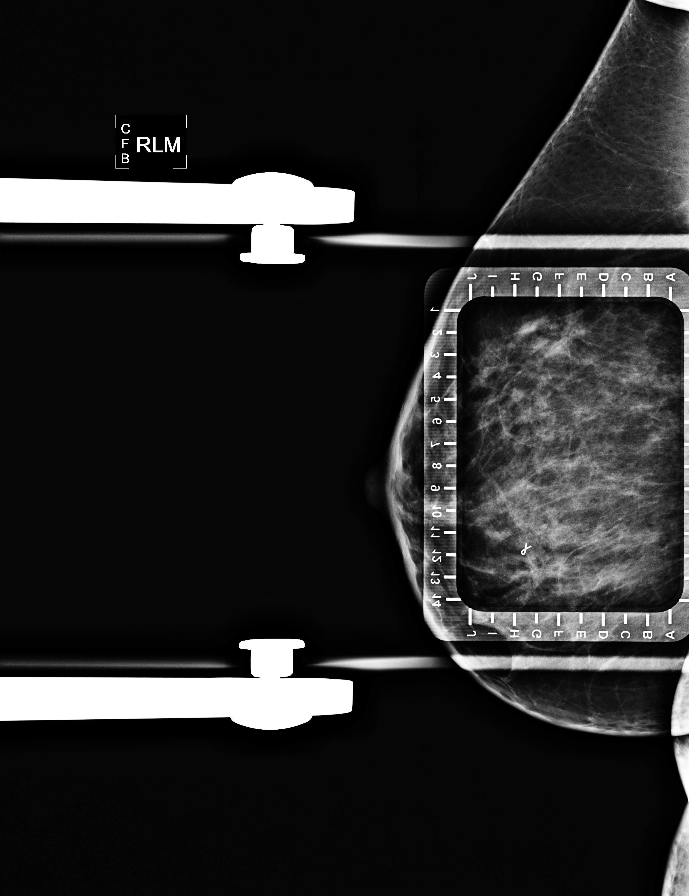

[R LM (2 of 2)]
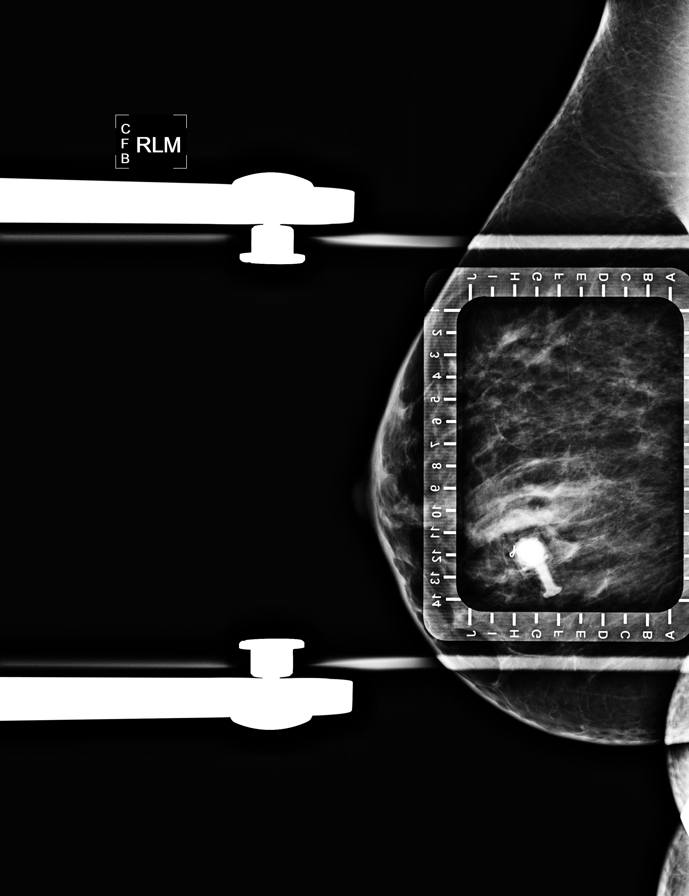

[R CC (1 of 5)]
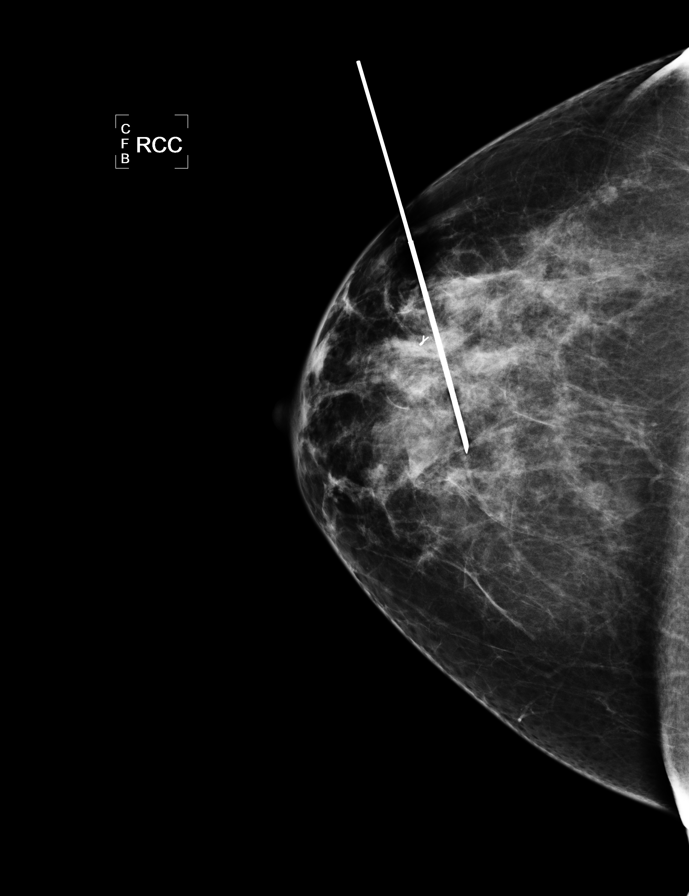

[R CC (2 of 5)]
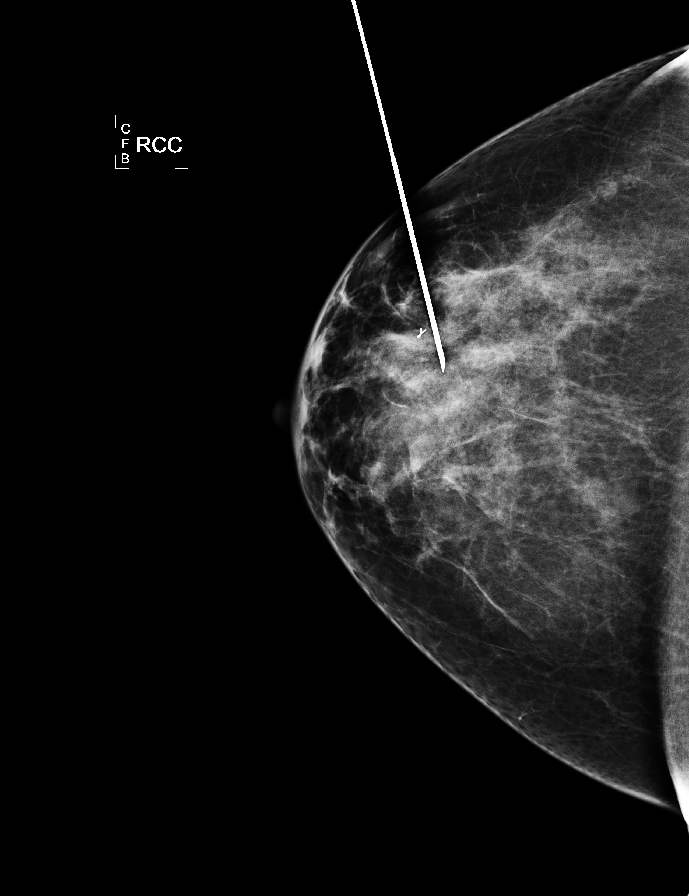

[R CC (3 of 5)]
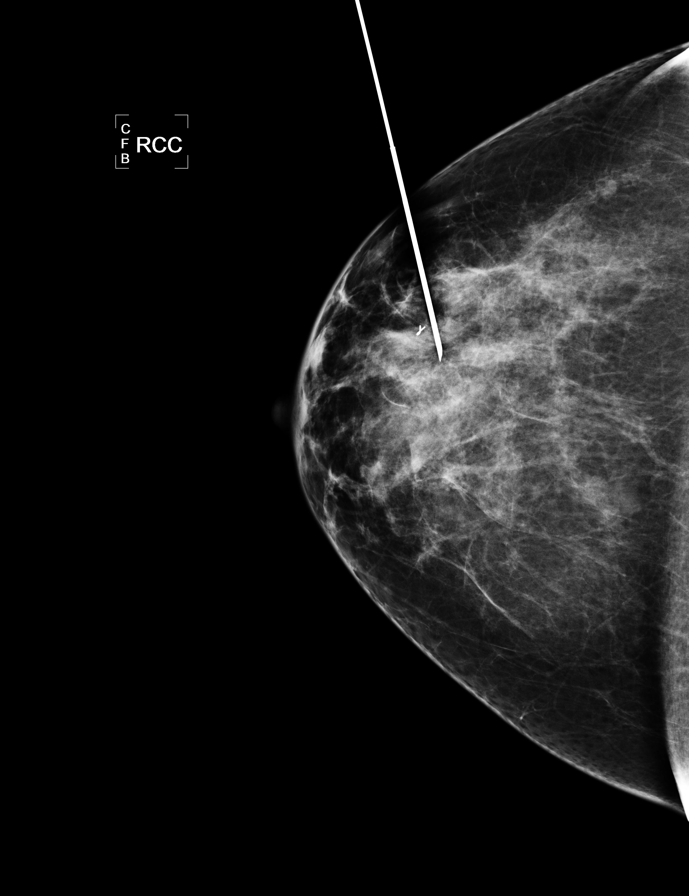

[R CC (4 of 5)]
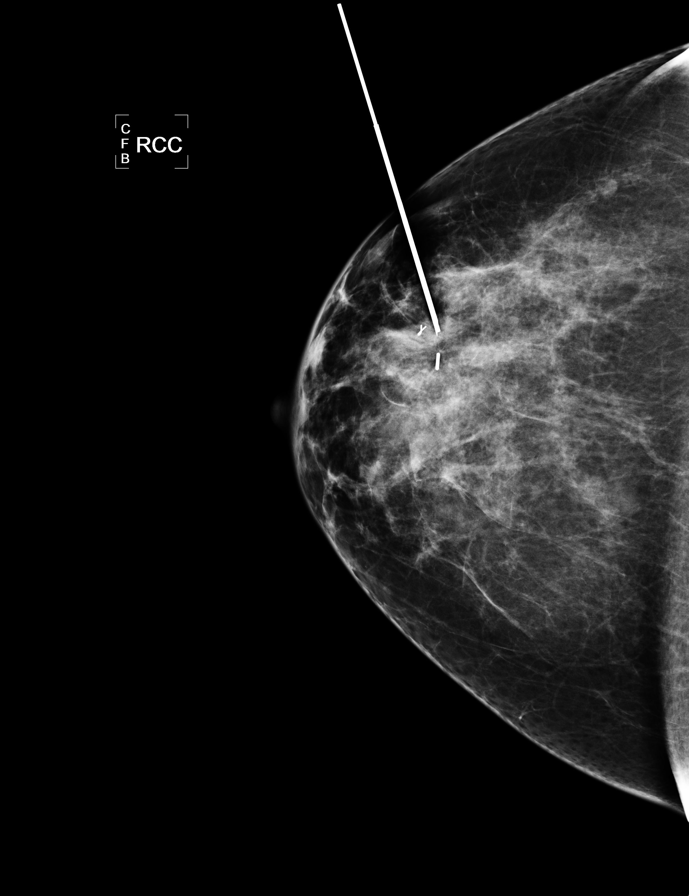

[R CC (5 of 5)]
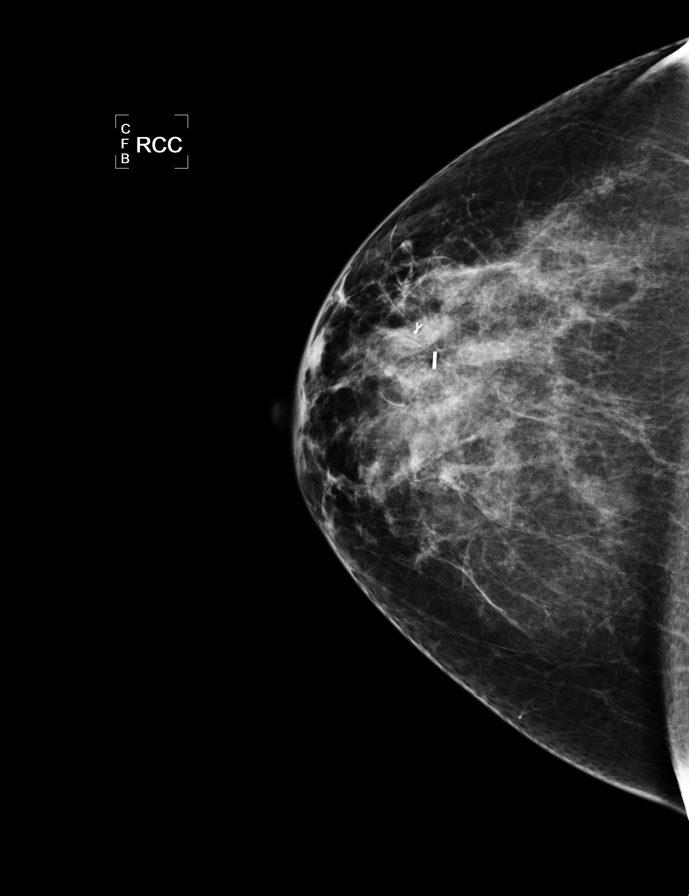

[R ML]
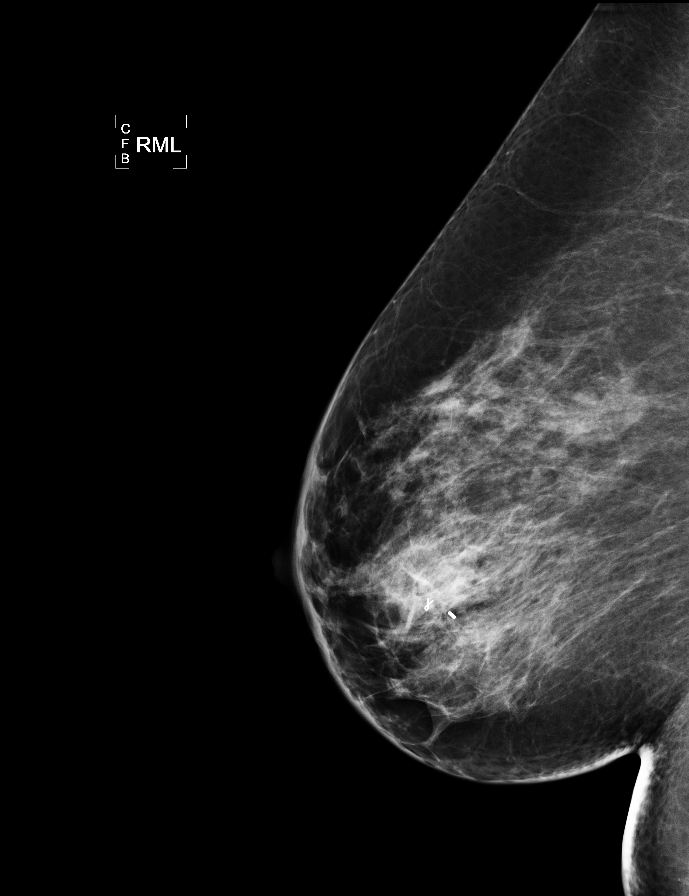

[8 of 8 positions shown; findings below may reference images not displayed]



The usual time-out protocol was performed immediately prior to the
procedure.

Using mammographic guidance, sterile technique, 2% lidocaine and an
9-MLR radioactive seed, the ribbon shaped clip and distortion was
localized using a lateral to medial approach. The follow-up
mammogram images confirm the seed in the expected location and were
marked for Dr. Jumper.

Follow-up survey of the patient confirms presence of the radioactive
seed.

Order number of 9-MLR seed:  [id number].

Total activity:  0.262 mCi  Reference Date: 03/18/2015

The patient tolerated the procedure well and was released from the
[REDACTED]. She was given instructions regarding seed removal.
IMPRESSION: Radioactive seed localization right breast. No apparent
complications.

## 2016-06-18 ENCOUNTER — Ambulatory Visit (INDEPENDENT_AMBULATORY_CARE_PROVIDER_SITE_OTHER): Payer: BLUE CROSS/BLUE SHIELD | Admitting: Sports Medicine

## 2016-06-18 ENCOUNTER — Encounter: Payer: Self-pay | Admitting: Sports Medicine

## 2016-06-18 VITALS — BP 126/85 | Ht 62.0 in | Wt 180.0 lb

## 2016-06-18 DIAGNOSIS — Q667 Congenital pes cavus, unspecified foot: Secondary | ICD-10-CM

## 2016-06-18 DIAGNOSIS — M7742 Metatarsalgia, left foot: Secondary | ICD-10-CM | POA: Diagnosis not present

## 2016-06-18 NOTE — Assessment & Plan Note (Signed)
Patient was fitted for a : standard, cushioned, semi-rigid orthotic. The orthotic was heated and afterward the patient stood on the orthotic blank positioned on the orthotic stand. The patient was positioned in subtalar neutral position and 10 degrees of ankle dorsiflexion in a weight bearing stance. After completion of molding, a stable base was applied to the orthotic blank. The blank was ground to a stable position for weight bearing. Size:7 red EVA Base:blue med EVA Posting: Lateral column Additional orthotic padding:: large MT pads  We spent 40 mins in assessment and preparation of orthotics/ at least 50% of time spend in discussing options for foot support and use of orthotics.

## 2016-06-18 NOTE — Progress Notes (Signed)
Patient ID: [name], female   DOB: [birth year], 55 y.o.   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

## 2016-06-18 NOTE — Assessment & Plan Note (Signed)
New large MT pads added to orthotics  Consider adding these to older orthotics if helpful

## 2017-08-12 DIAGNOSIS — M720 Palmar fascial fibromatosis [Dupuytren]: Secondary | ICD-10-CM | POA: Insufficient documentation

## 2017-10-25 ENCOUNTER — Other Ambulatory Visit: Payer: Self-pay

## 2018-07-30 MED FILL — SYNTHROID 150 MCG TABLET: 150 | 90 days supply | Qty: 90 | Fill #0

## 2018-08-04 MED FILL — NUVARING VAGINAL RING: 0.12-0.015 | 84 days supply | Qty: 3 | Fill #0

## 2018-08-12 MED FILL — buPROPion HCL ER (XL) 150 M: 150 | 90 days supply | Qty: 90 | Fill #0

## 2018-09-01 MED FILL — DULERA 200 MCG/5 MCG INH: 200-5 | 60 days supply | Qty: 13 | Fill #0

## 2018-09-22 MED FILL — FLUoxetine HCL 20 MG CAPS: 20 | 90 days supply | Qty: 90 | Fill #0

## 2018-09-22 MED FILL — LEVOCETIRIZINE 5 MG TABLET: 5 | 90 days supply | Qty: 90 | Fill #0

## 2018-10-15 ENCOUNTER — Telehealth: Payer: BLUE CROSS/BLUE SHIELD | Admitting: Nurse Practitioner

## 2018-10-15 DIAGNOSIS — M545 Low back pain, unspecified: Secondary | ICD-10-CM

## 2018-10-15 MED ORDER — CYCLOBENZAPRINE HCL 10 MG PO TABS: 10.0000 mg | ORAL_TABLET | Freq: Three times a day (TID) | ORAL | 1 refills | Status: DC | PRN

## 2018-10-15 MED ORDER — NAPROXEN 500 MG PO TABS: 500.0000 mg | ORAL_TABLET | Freq: Two times a day (BID) | ORAL | 1 refills | Status: DC

## 2018-10-15 MED FILL — NAPROXEN 500 MG TABLET: 500 | 30 days supply | Qty: 60 | Fill #0

## 2018-10-15 MED FILL — CYCLOBENZAPRINE HCL 10 MG T: 10 | 10 days supply | Qty: 30 | Fill #0

## 2018-10-15 NOTE — Progress Notes (Signed)

## 2018-10-29 DIAGNOSIS — M545 Low back pain: Secondary | ICD-10-CM | POA: Diagnosis not present

## 2018-10-29 MED FILL — predniSONE 10 MG (21) TBPK: 10 | 12 days supply | Qty: 21 | Fill #0

## 2018-11-03 MED FILL — SYNTHROID 150 MCG TABLET: 150 | 90 days supply | Qty: 90 | Fill #1

## 2018-11-14 MED FILL — buPROPion HCL ER (XL) 150 M: 150 | 90 days supply | Qty: 90 | Fill #1

## 2018-11-24 DIAGNOSIS — M25551 Pain in right hip: Secondary | ICD-10-CM | POA: Diagnosis not present

## 2018-11-24 DIAGNOSIS — M9903 Segmental and somatic dysfunction of lumbar region: Secondary | ICD-10-CM | POA: Diagnosis not present

## 2018-11-24 DIAGNOSIS — M5136 Other intervertebral disc degeneration, lumbar region: Secondary | ICD-10-CM | POA: Diagnosis not present

## 2018-11-24 DIAGNOSIS — M9905 Segmental and somatic dysfunction of pelvic region: Secondary | ICD-10-CM | POA: Diagnosis not present

## 2018-11-26 DIAGNOSIS — M9903 Segmental and somatic dysfunction of lumbar region: Secondary | ICD-10-CM | POA: Diagnosis not present

## 2018-11-26 DIAGNOSIS — M5136 Other intervertebral disc degeneration, lumbar region: Secondary | ICD-10-CM | POA: Diagnosis not present

## 2018-11-26 DIAGNOSIS — M9905 Segmental and somatic dysfunction of pelvic region: Secondary | ICD-10-CM | POA: Diagnosis not present

## 2018-11-26 DIAGNOSIS — M25551 Pain in right hip: Secondary | ICD-10-CM | POA: Diagnosis not present

## 2018-11-28 MED FILL — NAPROXEN 500 MG TABLET: 500 | 30 days supply | Qty: 60 | Fill #1

## 2018-11-28 MED FILL — DULERA 200 MCG/5 MCG INH: 200-5 | 60 days supply | Qty: 13 | Fill #1

## 2018-12-01 DIAGNOSIS — M9905 Segmental and somatic dysfunction of pelvic region: Secondary | ICD-10-CM | POA: Diagnosis not present

## 2018-12-01 DIAGNOSIS — M25551 Pain in right hip: Secondary | ICD-10-CM | POA: Diagnosis not present

## 2018-12-01 DIAGNOSIS — M5136 Other intervertebral disc degeneration, lumbar region: Secondary | ICD-10-CM | POA: Diagnosis not present

## 2018-12-01 DIAGNOSIS — M9903 Segmental and somatic dysfunction of lumbar region: Secondary | ICD-10-CM | POA: Diagnosis not present

## 2018-12-03 DIAGNOSIS — M25551 Pain in right hip: Secondary | ICD-10-CM | POA: Diagnosis not present

## 2018-12-03 DIAGNOSIS — M9903 Segmental and somatic dysfunction of lumbar region: Secondary | ICD-10-CM | POA: Diagnosis not present

## 2018-12-03 DIAGNOSIS — M5136 Other intervertebral disc degeneration, lumbar region: Secondary | ICD-10-CM | POA: Diagnosis not present

## 2018-12-03 DIAGNOSIS — M9905 Segmental and somatic dysfunction of pelvic region: Secondary | ICD-10-CM | POA: Diagnosis not present

## 2018-12-04 DIAGNOSIS — M9903 Segmental and somatic dysfunction of lumbar region: Secondary | ICD-10-CM | POA: Diagnosis not present

## 2018-12-04 DIAGNOSIS — M9905 Segmental and somatic dysfunction of pelvic region: Secondary | ICD-10-CM | POA: Diagnosis not present

## 2018-12-04 DIAGNOSIS — M5136 Other intervertebral disc degeneration, lumbar region: Secondary | ICD-10-CM | POA: Diagnosis not present

## 2018-12-04 DIAGNOSIS — M25551 Pain in right hip: Secondary | ICD-10-CM | POA: Diagnosis not present

## 2018-12-08 DIAGNOSIS — M9903 Segmental and somatic dysfunction of lumbar region: Secondary | ICD-10-CM | POA: Diagnosis not present

## 2018-12-08 DIAGNOSIS — M5136 Other intervertebral disc degeneration, lumbar region: Secondary | ICD-10-CM | POA: Diagnosis not present

## 2018-12-08 DIAGNOSIS — M25551 Pain in right hip: Secondary | ICD-10-CM | POA: Diagnosis not present

## 2018-12-08 DIAGNOSIS — M9905 Segmental and somatic dysfunction of pelvic region: Secondary | ICD-10-CM | POA: Diagnosis not present

## 2018-12-10 DIAGNOSIS — M9905 Segmental and somatic dysfunction of pelvic region: Secondary | ICD-10-CM | POA: Diagnosis not present

## 2018-12-10 DIAGNOSIS — M9903 Segmental and somatic dysfunction of lumbar region: Secondary | ICD-10-CM | POA: Diagnosis not present

## 2018-12-10 DIAGNOSIS — M25551 Pain in right hip: Secondary | ICD-10-CM | POA: Diagnosis not present

## 2018-12-10 DIAGNOSIS — M5136 Other intervertebral disc degeneration, lumbar region: Secondary | ICD-10-CM | POA: Diagnosis not present

## 2018-12-11 DIAGNOSIS — M9905 Segmental and somatic dysfunction of pelvic region: Secondary | ICD-10-CM | POA: Diagnosis not present

## 2018-12-11 DIAGNOSIS — M25551 Pain in right hip: Secondary | ICD-10-CM | POA: Diagnosis not present

## 2018-12-11 DIAGNOSIS — M5136 Other intervertebral disc degeneration, lumbar region: Secondary | ICD-10-CM | POA: Diagnosis not present

## 2018-12-11 DIAGNOSIS — M9903 Segmental and somatic dysfunction of lumbar region: Secondary | ICD-10-CM | POA: Diagnosis not present

## 2018-12-13 MED FILL — FLUoxetine HCL 20 MG CAPS: 20 | 90 days supply | Qty: 90 | Fill #1

## 2018-12-15 MED FILL — LEVOCETIRIZINE 5 MG TABLET: 5 | 90 days supply | Qty: 90 | Fill #1

## 2018-12-22 DIAGNOSIS — M9903 Segmental and somatic dysfunction of lumbar region: Secondary | ICD-10-CM | POA: Diagnosis not present

## 2018-12-22 DIAGNOSIS — M25551 Pain in right hip: Secondary | ICD-10-CM | POA: Diagnosis not present

## 2018-12-22 DIAGNOSIS — M5136 Other intervertebral disc degeneration, lumbar region: Secondary | ICD-10-CM | POA: Diagnosis not present

## 2018-12-22 DIAGNOSIS — M9905 Segmental and somatic dysfunction of pelvic region: Secondary | ICD-10-CM | POA: Diagnosis not present

## 2018-12-23 DIAGNOSIS — M9905 Segmental and somatic dysfunction of pelvic region: Secondary | ICD-10-CM | POA: Diagnosis not present

## 2018-12-23 DIAGNOSIS — M5136 Other intervertebral disc degeneration, lumbar region: Secondary | ICD-10-CM | POA: Diagnosis not present

## 2018-12-23 DIAGNOSIS — M25551 Pain in right hip: Secondary | ICD-10-CM | POA: Diagnosis not present

## 2018-12-23 DIAGNOSIS — M47816 Spondylosis without myelopathy or radiculopathy, lumbar region: Secondary | ICD-10-CM | POA: Diagnosis not present

## 2018-12-23 DIAGNOSIS — M9903 Segmental and somatic dysfunction of lumbar region: Secondary | ICD-10-CM | POA: Diagnosis not present

## 2018-12-29 DIAGNOSIS — M5136 Other intervertebral disc degeneration, lumbar region: Secondary | ICD-10-CM | POA: Diagnosis not present

## 2018-12-29 DIAGNOSIS — M25551 Pain in right hip: Secondary | ICD-10-CM | POA: Diagnosis not present

## 2018-12-29 DIAGNOSIS — M9905 Segmental and somatic dysfunction of pelvic region: Secondary | ICD-10-CM | POA: Diagnosis not present

## 2018-12-29 DIAGNOSIS — M9903 Segmental and somatic dysfunction of lumbar region: Secondary | ICD-10-CM | POA: Diagnosis not present

## 2018-12-30 DIAGNOSIS — M9903 Segmental and somatic dysfunction of lumbar region: Secondary | ICD-10-CM | POA: Diagnosis not present

## 2018-12-30 DIAGNOSIS — M25551 Pain in right hip: Secondary | ICD-10-CM | POA: Diagnosis not present

## 2018-12-30 DIAGNOSIS — M5136 Other intervertebral disc degeneration, lumbar region: Secondary | ICD-10-CM | POA: Diagnosis not present

## 2018-12-30 DIAGNOSIS — M9905 Segmental and somatic dysfunction of pelvic region: Secondary | ICD-10-CM | POA: Diagnosis not present

## 2019-01-01 DIAGNOSIS — M25551 Pain in right hip: Secondary | ICD-10-CM | POA: Diagnosis not present

## 2019-01-01 DIAGNOSIS — M5136 Other intervertebral disc degeneration, lumbar region: Secondary | ICD-10-CM | POA: Diagnosis not present

## 2019-01-01 DIAGNOSIS — M9903 Segmental and somatic dysfunction of lumbar region: Secondary | ICD-10-CM | POA: Diagnosis not present

## 2019-01-01 DIAGNOSIS — M9905 Segmental and somatic dysfunction of pelvic region: Secondary | ICD-10-CM | POA: Diagnosis not present

## 2019-01-05 DIAGNOSIS — M9903 Segmental and somatic dysfunction of lumbar region: Secondary | ICD-10-CM | POA: Diagnosis not present

## 2019-01-05 DIAGNOSIS — M9905 Segmental and somatic dysfunction of pelvic region: Secondary | ICD-10-CM | POA: Diagnosis not present

## 2019-01-05 DIAGNOSIS — M25551 Pain in right hip: Secondary | ICD-10-CM | POA: Diagnosis not present

## 2019-01-05 DIAGNOSIS — M5136 Other intervertebral disc degeneration, lumbar region: Secondary | ICD-10-CM | POA: Diagnosis not present

## 2019-01-06 ENCOUNTER — Ambulatory Visit (INDEPENDENT_AMBULATORY_CARE_PROVIDER_SITE_OTHER): Payer: 59 | Admitting: Family Medicine

## 2019-01-06 ENCOUNTER — Ambulatory Visit (INDEPENDENT_AMBULATORY_CARE_PROVIDER_SITE_OTHER): Payer: Self-pay

## 2019-01-06 ENCOUNTER — Encounter (INDEPENDENT_AMBULATORY_CARE_PROVIDER_SITE_OTHER): Payer: Self-pay | Admitting: Family Medicine

## 2019-01-06 DIAGNOSIS — M25562 Pain in left knee: Secondary | ICD-10-CM | POA: Diagnosis not present

## 2019-01-06 DIAGNOSIS — M25561 Pain in right knee: Secondary | ICD-10-CM | POA: Diagnosis not present

## 2019-01-06 MED ORDER — DICLOFENAC SODIUM 1 % TD GEL: 4.0000 g | Freq: Four times a day (QID) | TRANSDERMAL | 6 refills | Status: AC | PRN

## 2019-01-06 MED ORDER — ETODOLAC 400 MG PO TABS: 400.0000 mg | ORAL_TABLET | Freq: Two times a day (BID) | ORAL | 3 refills | Status: DC | PRN

## 2019-01-06 MED FILL — DICLOFENAC SODIUM 1 % GEL: 1 | 31 days supply | Qty: 500 | Fill #0

## 2019-01-06 MED FILL — ETODOLAC 400 MG TABLET: 400 | 30 days supply | Qty: 60 | Fill #0

## 2019-01-06 NOTE — Progress Notes (Signed)
  [name] - 58 y.o. female MRN [id number]  Date of birth: [birth year]    SUBJECTIVE:      Chief Complaint:/ HPI:  58 year old female with about 3 weeks of left knee pain.  Patient denies any specific mechanism of injury.  She has been seeing a chiropractor due to back pain.  She had been wearing a heel lift prescribed by the chiropractor.  Following onset of her knee pain, the chiropractor did some manipulation of the knee.  Her pain began initially at the inferior pole patella tendon progressed to involve the entire knee.  This manipulation as well as her increasing her walking for exercise yet worsen her pain.  She is taking Advil 600 to 800 mg 3 times a day as a for pain.  It does provide some relief and allow her to get through the workday.  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  Her pain is made worse with walking or going up or down stairs.  She denies any swelling, erythema, or bruising.  No numbness or tingling associated.  No associated skin changes   ROS:     See HPI  PERTINENT  PMH / PSH FH / / SH:  Past Medical, Surgical, Social, and Family History Reviewed & Updated in the EMR.     OBJECTIVE: There were no vitals taken for this visit.  Physical Exam:  Vital signs are reviewed.  GEN: Alert and oriented, NAD Pulm: Breathing unlabored PSY: normal mood, congruent affect  MSK: Right knee: - Inspection: no gross deformity. No swelling/effusion, erythema or bruising. Skin intact - Palpation: Patient is primarily tender over the medial and lateral joint lines.  She does have some posterior tenderness as well as tenderness over the medial lateral patellofemoral joint - ROM: full active ROM with flexion and extension in knee - Strength: 5/5 strength - Neuro/vasc: NV intact - Special Tests: - LIGAMENTS: negative anterior and posterior drawer, negative Lachman's, no MCL or LCL laxity  -- MENISCUS: Pain with McMurray's  Left knee: No obvious deformity Full range of motion  with 5/5 strength N/V intact distally   ASSESSMENT & PLAN:  1.  Right knee pain-pain secondary to arthritis.  X-rays obtained today show mild degenerative changes of the left knee.  Multiple treatment options were discussed today - Voltaren gel -Quadricep strengthening exercises - NSAIDs as needed - Follow-up as needed

## 2019-01-06 NOTE — Patient Instructions (Signed)
    Glucosamine sulfate 1,000 mg twice daily  Turmeric 500 mg twice daily  Straight leg raises  Avoid squats and kneeling   Injection or MRI if doesn't improve

## 2019-01-06 NOTE — Progress Notes (Signed)
I saw and examined the patient with Dr. Okey Dupre and agree with assessment and plan as outlined.  OA left patellofemoral joint, possible degenerative meniscus injury.  Try oral and topical NSAID, glucosamine, straight leg raises.  Injection and/or MRI if persists.  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

## 2019-01-07 DIAGNOSIS — M9905 Segmental and somatic dysfunction of pelvic region: Secondary | ICD-10-CM | POA: Diagnosis not present

## 2019-01-07 DIAGNOSIS — M9903 Segmental and somatic dysfunction of lumbar region: Secondary | ICD-10-CM | POA: Diagnosis not present

## 2019-01-07 DIAGNOSIS — M25551 Pain in right hip: Secondary | ICD-10-CM | POA: Diagnosis not present

## 2019-01-07 DIAGNOSIS — M5136 Other intervertebral disc degeneration, lumbar region: Secondary | ICD-10-CM | POA: Diagnosis not present

## 2019-01-08 DIAGNOSIS — M9905 Segmental and somatic dysfunction of pelvic region: Secondary | ICD-10-CM | POA: Diagnosis not present

## 2019-01-08 DIAGNOSIS — M25551 Pain in right hip: Secondary | ICD-10-CM | POA: Diagnosis not present

## 2019-01-08 DIAGNOSIS — M5136 Other intervertebral disc degeneration, lumbar region: Secondary | ICD-10-CM | POA: Diagnosis not present

## 2019-01-08 DIAGNOSIS — M9903 Segmental and somatic dysfunction of lumbar region: Secondary | ICD-10-CM | POA: Diagnosis not present

## 2019-01-13 DIAGNOSIS — M9903 Segmental and somatic dysfunction of lumbar region: Secondary | ICD-10-CM | POA: Diagnosis not present

## 2019-01-13 DIAGNOSIS — M5136 Other intervertebral disc degeneration, lumbar region: Secondary | ICD-10-CM | POA: Diagnosis not present

## 2019-01-13 DIAGNOSIS — M9905 Segmental and somatic dysfunction of pelvic region: Secondary | ICD-10-CM | POA: Diagnosis not present

## 2019-01-13 DIAGNOSIS — M25551 Pain in right hip: Secondary | ICD-10-CM | POA: Diagnosis not present

## 2019-01-16 DIAGNOSIS — M9903 Segmental and somatic dysfunction of lumbar region: Secondary | ICD-10-CM | POA: Diagnosis not present

## 2019-01-16 DIAGNOSIS — M25551 Pain in right hip: Secondary | ICD-10-CM | POA: Diagnosis not present

## 2019-01-16 DIAGNOSIS — M5136 Other intervertebral disc degeneration, lumbar region: Secondary | ICD-10-CM | POA: Diagnosis not present

## 2019-01-16 DIAGNOSIS — M9905 Segmental and somatic dysfunction of pelvic region: Secondary | ICD-10-CM | POA: Diagnosis not present

## 2019-01-20 DIAGNOSIS — M9905 Segmental and somatic dysfunction of pelvic region: Secondary | ICD-10-CM | POA: Diagnosis not present

## 2019-01-20 DIAGNOSIS — M9903 Segmental and somatic dysfunction of lumbar region: Secondary | ICD-10-CM | POA: Diagnosis not present

## 2019-01-20 DIAGNOSIS — M5136 Other intervertebral disc degeneration, lumbar region: Secondary | ICD-10-CM | POA: Diagnosis not present

## 2019-01-20 DIAGNOSIS — M25551 Pain in right hip: Secondary | ICD-10-CM | POA: Diagnosis not present

## 2019-01-26 DIAGNOSIS — M9903 Segmental and somatic dysfunction of lumbar region: Secondary | ICD-10-CM | POA: Diagnosis not present

## 2019-01-26 DIAGNOSIS — M25551 Pain in right hip: Secondary | ICD-10-CM | POA: Diagnosis not present

## 2019-01-26 DIAGNOSIS — M9905 Segmental and somatic dysfunction of pelvic region: Secondary | ICD-10-CM | POA: Diagnosis not present

## 2019-01-26 DIAGNOSIS — M5136 Other intervertebral disc degeneration, lumbar region: Secondary | ICD-10-CM | POA: Diagnosis not present

## 2019-01-29 MED FILL — SYNTHROID 150 MCG TABLET: 150 | 90 days supply | Qty: 90 | Fill #0

## 2019-01-30 DIAGNOSIS — F419 Anxiety disorder, unspecified: Secondary | ICD-10-CM | POA: Diagnosis not present

## 2019-01-30 DIAGNOSIS — Z6836 Body mass index (BMI) 36.0-36.9, adult: Secondary | ICD-10-CM | POA: Diagnosis not present

## 2019-01-30 DIAGNOSIS — J309 Allergic rhinitis, unspecified: Secondary | ICD-10-CM | POA: Diagnosis not present

## 2019-01-30 DIAGNOSIS — F3341 Major depressive disorder, recurrent, in partial remission: Secondary | ICD-10-CM | POA: Diagnosis not present

## 2019-01-30 DIAGNOSIS — G479 Sleep disorder, unspecified: Secondary | ICD-10-CM | POA: Diagnosis not present

## 2019-01-30 DIAGNOSIS — E039 Hypothyroidism, unspecified: Secondary | ICD-10-CM | POA: Diagnosis not present

## 2019-01-30 DIAGNOSIS — J45991 Cough variant asthma: Secondary | ICD-10-CM | POA: Diagnosis not present

## 2019-02-02 DIAGNOSIS — M9903 Segmental and somatic dysfunction of lumbar region: Secondary | ICD-10-CM | POA: Diagnosis not present

## 2019-02-02 DIAGNOSIS — M25551 Pain in right hip: Secondary | ICD-10-CM | POA: Diagnosis not present

## 2019-02-02 DIAGNOSIS — M9905 Segmental and somatic dysfunction of pelvic region: Secondary | ICD-10-CM | POA: Diagnosis not present

## 2019-02-02 DIAGNOSIS — M5136 Other intervertebral disc degeneration, lumbar region: Secondary | ICD-10-CM | POA: Diagnosis not present

## 2019-02-03 DIAGNOSIS — M545 Low back pain: Secondary | ICD-10-CM | POA: Diagnosis not present

## 2019-02-03 DIAGNOSIS — J45991 Cough variant asthma: Secondary | ICD-10-CM | POA: Diagnosis not present

## 2019-02-03 DIAGNOSIS — F419 Anxiety disorder, unspecified: Secondary | ICD-10-CM | POA: Diagnosis not present

## 2019-02-03 DIAGNOSIS — F3341 Major depressive disorder, recurrent, in partial remission: Secondary | ICD-10-CM | POA: Diagnosis not present

## 2019-02-03 DIAGNOSIS — J309 Allergic rhinitis, unspecified: Secondary | ICD-10-CM | POA: Diagnosis not present

## 2019-02-03 DIAGNOSIS — E039 Hypothyroidism, unspecified: Secondary | ICD-10-CM | POA: Diagnosis not present

## 2019-02-03 DIAGNOSIS — G8929 Other chronic pain: Secondary | ICD-10-CM | POA: Diagnosis not present

## 2019-02-03 DIAGNOSIS — G479 Sleep disorder, unspecified: Secondary | ICD-10-CM | POA: Diagnosis not present

## 2019-02-03 DIAGNOSIS — M25561 Pain in right knee: Secondary | ICD-10-CM | POA: Diagnosis not present

## 2019-02-03 MED FILL — DULERA 200 MCG/5 MCG INH: 200-5 | 30 days supply | Qty: 13 | Fill #0

## 2019-02-03 MED FILL — buPROPion HCL ER (XL) 150 M: 150 | 90 days supply | Qty: 90 | Fill #0

## 2019-02-10 MED FILL — ETODOLAC 400 MG TABLET: 400 | 30 days supply | Qty: 60 | Fill #1

## 2019-02-11 DIAGNOSIS — M9905 Segmental and somatic dysfunction of pelvic region: Secondary | ICD-10-CM | POA: Diagnosis not present

## 2019-02-11 DIAGNOSIS — M25551 Pain in right hip: Secondary | ICD-10-CM | POA: Diagnosis not present

## 2019-02-11 DIAGNOSIS — M5136 Other intervertebral disc degeneration, lumbar region: Secondary | ICD-10-CM | POA: Diagnosis not present

## 2019-02-11 DIAGNOSIS — M9903 Segmental and somatic dysfunction of lumbar region: Secondary | ICD-10-CM | POA: Diagnosis not present

## 2019-03-03 MED FILL — PEG-3350 SOLUTION: 420 | 2 days supply | Qty: 4000 | Fill #0

## 2019-03-06 DIAGNOSIS — D123 Benign neoplasm of transverse colon: Secondary | ICD-10-CM | POA: Diagnosis not present

## 2019-03-06 DIAGNOSIS — D122 Benign neoplasm of ascending colon: Secondary | ICD-10-CM | POA: Diagnosis not present

## 2019-03-06 DIAGNOSIS — Z86010 Personal history of colonic polyps: Secondary | ICD-10-CM | POA: Diagnosis not present

## 2019-03-11 MED FILL — LEVOCETIRIZINE 5 MG TABLET: 5 | 90 days supply | Qty: 90 | Fill #0

## 2019-03-21 MED FILL — FLUoxetine HCL 20 MG CAPS: 20 | 90 days supply | Qty: 90 | Fill #0

## 2019-03-30 MED FILL — CYCLOBENZAPRINE HCL 10 MG T: 10 | 10 days supply | Qty: 30 | Fill #1

## 2019-04-28 MED FILL — SYNTHROID 150 MCG TABLET: 150 | 90 days supply | Qty: 90 | Fill #0

## 2019-04-28 MED FILL — buPROPion HCL ER (XL) 150 M: 150 | 90 days supply | Qty: 90 | Fill #1

## 2019-05-07 DIAGNOSIS — Z6838 Body mass index (BMI) 38.0-38.9, adult: Secondary | ICD-10-CM | POA: Diagnosis not present

## 2019-05-07 DIAGNOSIS — Z01419 Encounter for gynecological examination (general) (routine) without abnormal findings: Secondary | ICD-10-CM | POA: Diagnosis not present

## 2019-05-07 DIAGNOSIS — Z1231 Encounter for screening mammogram for malignant neoplasm of breast: Secondary | ICD-10-CM | POA: Diagnosis not present

## 2019-05-08 MED FILL — LORazepam 0.5 MG TABS: 0.5 | 15 days supply | Qty: 30 | Fill #0

## 2019-05-22 MED FILL — ZALEPLON 10 MG CAPS: 10 | 30 days supply | Qty: 30 | Fill #0

## 2019-06-16 MED FILL — FLUoxetine HCL 20 MG CAPS: 20 | 90 days supply | Qty: 90 | Fill #1

## 2019-06-16 MED FILL — LEVOCETIRIZINE 5 MG TABLET: 5 | 90 days supply | Qty: 90 | Fill #1

## 2019-06-29 MED FILL — ETODOLAC 400 MG TABS: 400 | 30 days supply | Qty: 60 | Fill #2

## 2019-07-21 MED FILL — SYNTHROID 150 MCG TABLET: 150 | 90 days supply | Qty: 90 | Fill #1

## 2019-07-27 MED FILL — buPROPion HCL ER (XL) 150 M: 150 | 90 days supply | Qty: 90 | Fill #0

## 2019-07-31 ENCOUNTER — Ambulatory Visit (INDEPENDENT_AMBULATORY_CARE_PROVIDER_SITE_OTHER): Payer: 59 | Admitting: Family Medicine

## 2019-07-31 ENCOUNTER — Other Ambulatory Visit: Payer: Self-pay

## 2019-07-31 ENCOUNTER — Encounter: Payer: Self-pay | Admitting: Family Medicine

## 2019-07-31 DIAGNOSIS — M25562 Pain in left knee: Secondary | ICD-10-CM

## 2019-07-31 DIAGNOSIS — G8929 Other chronic pain: Secondary | ICD-10-CM

## 2019-07-31 DIAGNOSIS — M2550 Pain in unspecified joint: Secondary | ICD-10-CM | POA: Diagnosis not present

## 2019-07-31 MED ORDER — ETODOLAC 400 MG PO TABS: 400.0000 mg | ORAL_TABLET | Freq: Two times a day (BID) | ORAL | 3 refills | Status: DC | PRN

## 2019-07-31 MED FILL — ETODOLAC 400 MG TABS: 400 | 30 days supply | Qty: 60 | Fill #0

## 2019-07-31 NOTE — Progress Notes (Signed)
Office Visit Note   Patient: [name]           Date of Birth: 10/06/1961           MRN: [id number] Visit Date: 07/31/2019 Requested by: Cari Caraway, [place],  [place] [address] PCP: Cari Caraway, MD  Subjective: Chief Complaint  Patient presents with  . Left Knee - Pain    Knee is some better with the etodolac - it actually helped with stiffness in all her joints.    HPI: She is here for follow-up chronic left knee pain.  Lodine helps, and also gives her relief from her chronic hand and other joint pain and stiffness.  She cannot go more than 1 day without the medication.  She is somewhat concerned about long-term side effects of this, but it does make her symptoms much more manageable.  Her mother has a history of arthritis.  Patient states that she had labs drawn when she was in her 27s to investigate for rheumatologic disease, and the labs were negative at that point but she has not had any since then.               ROS: Denies fevers or chills.  All other systems were reviewed and are negative.  Objective: Vital Signs: There were no vitals taken for this visit.  Physical Exam:  General:  Alert and oriented, in no acute distress. Pulm:  Breathing unlabored. Psy:  Normal mood, congruent affect. Skin: No visible rash. Hands: She has no deformities, no synovitis today.  Good range of motion of her joints. Left knee: 3+ patellofemoral crepitus in both knees.  No significant effusion today.  Tender on the medial joint line but no palpable click with McMurray's.   Imaging: None today.  Assessment & Plan: 1.  Chronic left knee pain probably due to osteoarthritis.  Multiple other joint pain as well. -Refilled Lodine to take as needed.  Labs drawn today to look for signs of rheumatologic disease.  Future options would include cortisone injection, Visco supplementation, prolotherapy with dextrose.  I will see her back as needed.     Procedures:  No procedures performed  No notes on file     PMFS History: Patient Active Problem List   Diagnosis Date Noted  . Dupuytren's contracture of right hand 08/12/2017  . Knee pain 08/25/2013  . Metatarsalgia of left foot 01/09/2012  . 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/22/2010   Past Medical History:  Diagnosis Date  . Exercise-induced asthma   . 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 Hypothyroidism   . Seasonal allergies   . Wears glasses     History reviewed. No pertinent family history.  Past Surgical History:  Procedure Laterality Date  . BREAST LUMPECTOMY WITH RADIOACTIVE SEED LOCALIZATION Right 03/29/2015   Procedure: RIGHT BREAST LUMPECTOMY WITH RADIOACTIVE SEED LOCALIZATION;  Surgeon: Rolm Bookbinder, MD;  Location: [facility];  Service: General;  Laterality: Right;  . COLONOSCOPY    . ORIF WRIST FRACTURE  2012   right  . REPAIR PERONEAL TENDONS ANKLE  2012  . TOTAL THYROIDECTOMY  2008  . WISDOM TOOTH EXTRACTION     Social History   Occupational History  . Not on file  Tobacco Use  . Smoking status: Never Smoker  Substance and Sexual Activity  . Alcohol use: Yes    Comment: occ  . Drug use: No  . Sexual activity: Not on file

## 2019-08-03 ENCOUNTER — Telehealth: Payer: Self-pay | Admitting: Family Medicine

## 2019-08-03 LAB — ANA: Anti Nuclear Antibody (ANA): POSITIVE — AB

## 2019-08-03 LAB — URIC ACID: Uric Acid, Serum: 4.9 mg/dL (ref 2.5–7.0)

## 2019-08-03 LAB — CBC WITH DIFFERENTIAL/PLATELET
Absolute Monocytes: 591 cells/uL (ref 200–950)
Basophils Absolute: 80 cells/uL (ref 0–200)
Basophils Relative: 1.1 %
Eosinophils Absolute: 307 cells/uL (ref 15–500)
Eosinophils Relative: 4.2 %
HCT: 38.1 % (ref 35.0–45.0)
Hemoglobin: 12.9 g/dL (ref 11.7–15.5)
Lymphs Abs: 1285 cells/uL (ref 850–3900)
MCH: 30.1 pg (ref 27.0–33.0)
MCHC: 33.9 g/dL (ref 32.0–36.0)
MCV: 89 fL (ref 80.0–100.0)
MPV: 10.8 fL (ref 7.5–12.5)
Monocytes Relative: 8.1 %
Neutro Abs: 5037 cells/uL (ref 1500–7800)
Neutrophils Relative %: 69 %
Platelets: 292 10*3/uL (ref 140–400)
RBC: 4.28 10*6/uL (ref 3.80–5.10)
RDW: 14.2 % (ref 11.0–15.0)
Total Lymphocyte: 17.6 %
WBC: 7.3 10*3/uL (ref 3.8–10.8)

## 2019-08-03 LAB — ANTI-NUCLEAR AB-TITER (ANA TITER): ANA Titer 1: 1:40 {titer} — ABNORMAL HIGH

## 2019-08-03 LAB — CYCLIC CITRUL PEPTIDE ANTIBODY, IGG: Cyclic Citrullin Peptide Ab: 19 UNITS

## 2019-08-03 LAB — BASIC METABOLIC PANEL
BUN: 16 mg/dL (ref 7–25)
CO2: 25 mmol/L (ref 20–32)
Calcium: 9.3 mg/dL (ref 8.6–10.4)
Chloride: 103 mmol/L (ref 98–110)
Creat: 0.91 mg/dL (ref 0.50–1.05)
Glucose, Bld: 99 mg/dL (ref 65–99)
Potassium: 4.2 mmol/L (ref 3.5–5.3)
Sodium: 139 mmol/L (ref 135–146)

## 2019-08-03 LAB — SEDIMENTATION RATE: Sed Rate: 39 mm/h — ABNORMAL HIGH (ref 0–30)

## 2019-08-03 LAB — C-REACTIVE PROTEIN: CRP: 15.8 mg/L — ABNORMAL HIGH (ref <8.0)

## 2019-08-03 LAB — RHEUMATOID FACTOR: Rhuematoid fact SerPl-aCnc: <14 IU/mL (ref <14)

## 2019-08-03 NOTE — Telephone Encounter (Signed)
Labs show:  Sed rate and CRP are elevated, indicating inflammation.    Two results are still pending, others are normal so far.  Will make recommendations after all results are in.

## 2019-08-04 ENCOUNTER — Telehealth: Payer: Self-pay | Admitting: Family Medicine

## 2019-08-04 NOTE — Telephone Encounter (Signed)
ANA is slightly positive.

## 2019-08-11 DIAGNOSIS — F419 Anxiety disorder, unspecified: Secondary | ICD-10-CM | POA: Diagnosis not present

## 2019-08-11 DIAGNOSIS — J309 Allergic rhinitis, unspecified: Secondary | ICD-10-CM | POA: Diagnosis not present

## 2019-08-11 DIAGNOSIS — G479 Sleep disorder, unspecified: Secondary | ICD-10-CM | POA: Diagnosis not present

## 2019-08-11 DIAGNOSIS — J45991 Cough variant asthma: Secondary | ICD-10-CM | POA: Diagnosis not present

## 2019-08-11 DIAGNOSIS — F3341 Major depressive disorder, recurrent, in partial remission: Secondary | ICD-10-CM | POA: Diagnosis not present

## 2019-08-11 DIAGNOSIS — E039 Hypothyroidism, unspecified: Secondary | ICD-10-CM | POA: Diagnosis not present

## 2019-08-14 DIAGNOSIS — G479 Sleep disorder, unspecified: Secondary | ICD-10-CM | POA: Diagnosis not present

## 2019-08-14 DIAGNOSIS — M159 Polyosteoarthritis, unspecified: Secondary | ICD-10-CM | POA: Diagnosis not present

## 2019-08-14 DIAGNOSIS — Z79899 Other long term (current) drug therapy: Secondary | ICD-10-CM | POA: Diagnosis not present

## 2019-08-14 DIAGNOSIS — F419 Anxiety disorder, unspecified: Secondary | ICD-10-CM | POA: Diagnosis not present

## 2019-08-14 DIAGNOSIS — J45991 Cough variant asthma: Secondary | ICD-10-CM | POA: Diagnosis not present

## 2019-08-14 DIAGNOSIS — E039 Hypothyroidism, unspecified: Secondary | ICD-10-CM | POA: Diagnosis not present

## 2019-08-14 DIAGNOSIS — F3340 Major depressive disorder, recurrent, in remission, unspecified: Secondary | ICD-10-CM | POA: Diagnosis not present

## 2019-08-14 DIAGNOSIS — E669 Obesity, unspecified: Secondary | ICD-10-CM | POA: Diagnosis not present

## 2019-08-14 DIAGNOSIS — J309 Allergic rhinitis, unspecified: Secondary | ICD-10-CM | POA: Diagnosis not present

## 2019-08-14 MED FILL — ALBUTEROL SULFATE HFA 108 (: 108 (90 BAS | 16 days supply | Qty: 18 | Fill #0

## 2019-08-14 MED FILL — CLOBETASOL PROP 0.05% OINT: 0.05 | 15 days supply | Qty: 30 | Fill #0

## 2019-08-14 MED FILL — ZALEPLON 10 MG CAPS: 10 | 30 days supply | Qty: 30 | Fill #0

## 2019-08-18 DIAGNOSIS — H5203 Hypermetropia, bilateral: Secondary | ICD-10-CM | POA: Diagnosis not present

## 2019-08-24 ENCOUNTER — Telehealth: Payer: Self-pay | Admitting: Family Medicine

## 2019-08-24 MED FILL — ETODOLAC 400 MG TABS: 400 | 30 days supply | Qty: 60 | Fill #1

## 2019-08-24 NOTE — Telephone Encounter (Signed)
Patient left a voicemail wanting to speak with you in regards to some acute knee pain that occurred over the weekend.  CB#[phone].  Thank you.

## 2019-08-25 ENCOUNTER — Telehealth: Payer: Self-pay | Admitting: Family Medicine

## 2019-08-25 NOTE — Telephone Encounter (Signed)
If it's improving, she can give it a couple more days.  But if it takes a turn for the worse, we could do a cortisone injection.  Can take etodolac too.

## 2019-08-25 NOTE — Telephone Encounter (Signed)
I called and spoke with the patient. She will continue the elevation, icing etodolac and I suggested an ACE wrap for compression. She will call us back on Friday to be seen for the cortisone injection, if no better by then. She is going on a trip to the mountains over the weekend and would like to be able to get around.

## 2019-08-25 NOTE — Telephone Encounter (Signed)
I called and spoke with the patient: She had an episode of sharp, stabbing pain underneath her kneecap and the knee gave way. She could not put weight on it due to pain. No specific injury, but she has been going up/down stairs more, as they are remodeling upstairs in her house. She elevated and iced the knee over the weekend and it is better today. She does still have occasional "zings" in the knee. The patient asks if she should come in, or give it more time and come in if it does not improve? She also has etodolac to take.

## 2019-08-25 NOTE — Telephone Encounter (Signed)
Patient returned call asked for a call back. The number to contact patient is [phone]

## 2019-08-25 NOTE — Telephone Encounter (Signed)
I called and left a message on the patient's mobile voice mail to call back or send a message through MyChart, if it is easier for her.

## 2019-08-25 NOTE — Telephone Encounter (Signed)
I called the patient - see the other message from today.

## 2019-08-26 MED FILL — CLOBETASOL PROP 0.05% OINT: 0.05 | 15 days supply | Qty: 30 | Fill #1

## 2019-08-26 MED FILL — ALBUTEROL SULFATE HFA 108 (: 108 (90 BAS | 16 days supply | Qty: 18 | Fill #1

## 2019-09-08 MED FILL — LEVOCETIRIZINE 5 MG TABLET: 5 | 90 days supply | Qty: 90 | Fill #0

## 2019-09-11 MED FILL — FLUoxetine HCL 20 MG CAPS: 20 | 90 days supply | Qty: 90 | Fill #0

## 2019-09-23 MED FILL — ETODOLAC 400 MG TABS: 400 | 30 days supply | Qty: 60 | Fill #2

## 2019-10-05 DIAGNOSIS — M545 Low back pain: Secondary | ICD-10-CM | POA: Diagnosis not present

## 2019-10-05 DIAGNOSIS — G8929 Other chronic pain: Secondary | ICD-10-CM | POA: Diagnosis not present

## 2019-10-05 MED FILL — predniSONE 10 MG (21) TBPK: 10 | 6 days supply | Qty: 21 | Fill #0

## 2019-10-05 MED FILL — traMADol HCL 50 MG TABS: 50 | 7 days supply | Qty: 21 | Fill #0

## 2019-10-19 MED FILL — buPROPion HCL ER (XL) 150 M: 150 | 90 days supply | Qty: 90 | Fill #0

## 2019-10-19 MED FILL — SYNTHROID 150 MCG TABLET: 150 | 84 days supply | Qty: 72 | Fill #0

## 2019-10-23 MED FILL — ETODOLAC 400 MG TABS: 400 | 30 days supply | Qty: 60 | Fill #3

## 2019-11-24 ENCOUNTER — Other Ambulatory Visit: Payer: Self-pay | Admitting: Family Medicine

## 2019-11-24 MED ORDER — ETODOLAC 400 MG PO TABS: 400.0000 mg | ORAL_TABLET | Freq: Two times a day (BID) | ORAL | 3 refills | Status: DC | PRN

## 2019-11-24 MED FILL — ETODOLAC 400 MG TABS: 400 | 30 days supply | Qty: 60 | Fill #3

## 2019-12-01 MED FILL — LEVOCETIRIZINE 5 MG TABLET: 5 | 90 days supply | Qty: 90 | Fill #1

## 2019-12-04 MED FILL — FLUoxetine HCL 20 MG CAPS: 20 | 90 days supply | Qty: 90 | Fill #1

## 2020-01-05 MED FILL — SYNTHROID 150 MCG TABLET: 150 | 84 days supply | Qty: 72 | Fill #1

## 2020-01-11 MED FILL — buPROPion HCL ER (XL) 150 M: 150 | 90 days supply | Qty: 90 | Fill #1

## 2020-02-09 DIAGNOSIS — N951 Menopausal and female climacteric states: Secondary | ICD-10-CM | POA: Diagnosis not present

## 2020-02-09 MED FILL — PROGESTERONE 100 MG CAPSULE: 100 | 90 days supply | Qty: 90 | Fill #0

## 2020-02-09 MED FILL — ESTRADIOL 0.05 MG/24HR PTTW: 0.05 | 28 days supply | Qty: 8 | Fill #0

## 2020-02-25 DIAGNOSIS — F419 Anxiety disorder, unspecified: Secondary | ICD-10-CM | POA: Diagnosis not present

## 2020-02-25 DIAGNOSIS — E039 Hypothyroidism, unspecified: Secondary | ICD-10-CM | POA: Diagnosis not present

## 2020-02-25 DIAGNOSIS — J45991 Cough variant asthma: Secondary | ICD-10-CM | POA: Diagnosis not present

## 2020-02-25 DIAGNOSIS — E669 Obesity, unspecified: Secondary | ICD-10-CM | POA: Diagnosis not present

## 2020-02-25 DIAGNOSIS — F3340 Major depressive disorder, recurrent, in remission, unspecified: Secondary | ICD-10-CM | POA: Diagnosis not present

## 2020-02-25 DIAGNOSIS — J309 Allergic rhinitis, unspecified: Secondary | ICD-10-CM | POA: Diagnosis not present

## 2020-02-25 DIAGNOSIS — Z79899 Other long term (current) drug therapy: Secondary | ICD-10-CM | POA: Diagnosis not present

## 2020-02-25 DIAGNOSIS — G479 Sleep disorder, unspecified: Secondary | ICD-10-CM | POA: Diagnosis not present

## 2020-02-25 DIAGNOSIS — M159 Polyosteoarthritis, unspecified: Secondary | ICD-10-CM | POA: Diagnosis not present

## 2020-02-29 MED FILL — LEVOCETIRIZINE 5 MG TABLET: 5 | 90 days supply | Qty: 90 | Fill #0

## 2020-03-02 MED FILL — ESTRADIOL 0.05 MG/24HR PTTW: 0.05 | 28 days supply | Qty: 8 | Fill #1

## 2020-03-03 DIAGNOSIS — N951 Menopausal and female climacteric states: Secondary | ICD-10-CM | POA: Diagnosis not present

## 2020-03-04 DIAGNOSIS — Z79899 Other long term (current) drug therapy: Secondary | ICD-10-CM | POA: Diagnosis not present

## 2020-03-04 DIAGNOSIS — F3340 Major depressive disorder, recurrent, in remission, unspecified: Secondary | ICD-10-CM | POA: Diagnosis not present

## 2020-03-04 DIAGNOSIS — M545 Low back pain: Secondary | ICD-10-CM | POA: Diagnosis not present

## 2020-03-04 DIAGNOSIS — F419 Anxiety disorder, unspecified: Secondary | ICD-10-CM | POA: Diagnosis not present

## 2020-03-04 DIAGNOSIS — M159 Polyosteoarthritis, unspecified: Secondary | ICD-10-CM | POA: Diagnosis not present

## 2020-03-04 DIAGNOSIS — E039 Hypothyroidism, unspecified: Secondary | ICD-10-CM | POA: Diagnosis not present

## 2020-03-04 DIAGNOSIS — G479 Sleep disorder, unspecified: Secondary | ICD-10-CM | POA: Diagnosis not present

## 2020-03-04 DIAGNOSIS — J309 Allergic rhinitis, unspecified: Secondary | ICD-10-CM | POA: Diagnosis not present

## 2020-03-04 DIAGNOSIS — J45991 Cough variant asthma: Secondary | ICD-10-CM | POA: Diagnosis not present

## 2020-03-04 MED FILL — FLUoxetine HCL 20 MG CAPS: 20 | 90 days supply | Qty: 90 | Fill #0

## 2020-03-04 MED FILL — SYMBICORT 80-4.5 MCG INH: 80-4.5 | 30 days supply | Qty: 10 | Fill #0

## 2020-03-12 MED FILL — ETODOLAC 400 MG TABS: 400 | 30 days supply | Qty: 60 | Fill #0

## 2020-03-14 ENCOUNTER — Other Ambulatory Visit: Payer: Self-pay | Admitting: Family Medicine

## 2020-03-14 MED ORDER — ETODOLAC 400 MG PO TABS: 400.0000 mg | ORAL_TABLET | Freq: Two times a day (BID) | ORAL | 3 refills | Status: DC | PRN

## 2020-03-28 MED FILL — SYMBICORT 80-4.5 MCG INH: 80-4.5 | 30 days supply | Qty: 10 | Fill #1

## 2020-03-29 MED FILL — SYNTHROID 150 MCG TABLET: 150 | 84 days supply | Qty: 72 | Fill #0

## 2020-03-30 MED FILL — ESTRADIOL 0.05 MG/24HR PTTW: 0.05 | 28 days supply | Qty: 8 | Fill #2

## 2020-04-05 MED FILL — ETODOLAC 400 MG TABS: 400 | 30 days supply | Qty: 60 | Fill #1

## 2020-04-11 MED FILL — buPROPion HCL ER (XL) 150 M: 150 | 90 days supply | Qty: 90 | Fill #0

## 2020-04-13 DIAGNOSIS — L821 Other seborrheic keratosis: Secondary | ICD-10-CM | POA: Diagnosis not present

## 2020-04-13 DIAGNOSIS — D225 Melanocytic nevi of trunk: Secondary | ICD-10-CM | POA: Diagnosis not present

## 2020-04-13 DIAGNOSIS — D235 Other benign neoplasm of skin of trunk: Secondary | ICD-10-CM | POA: Diagnosis not present

## 2020-04-27 MED FILL — SYMBICORT 80-4.5 MCG INH: 80-4.5 | 30 days supply | Qty: 10 | Fill #2

## 2020-04-27 MED FILL — ESTRADIOL 0.05 MG/24HR PTTW: 0.05 | 28 days supply | Qty: 8 | Fill #3

## 2020-05-17 ENCOUNTER — Other Ambulatory Visit (HOSPITAL_COMMUNITY): Payer: Self-pay | Admitting: Obstetrics and Gynecology

## 2020-05-17 DIAGNOSIS — Z1231 Encounter for screening mammogram for malignant neoplasm of breast: Secondary | ICD-10-CM | POA: Diagnosis not present

## 2020-05-17 DIAGNOSIS — Z6839 Body mass index (BMI) 39.0-39.9, adult: Secondary | ICD-10-CM | POA: Diagnosis not present

## 2020-05-17 DIAGNOSIS — Z01419 Encounter for gynecological examination (general) (routine) without abnormal findings: Secondary | ICD-10-CM | POA: Diagnosis not present

## 2020-05-17 MED FILL — CYCLOBENZAPRINE HCL 10 MG T: 10 | 30 days supply | Qty: 30 | Fill #0

## 2020-05-19 MED FILL — ESTRADIOL 0.05 MG/24HR PTTW: 0.05 | 84 days supply | Qty: 24 | Fill #0

## 2020-05-24 MED FILL — LEVOCETIRIZINE 5 MG TABLET: 5 | 90 days supply | Qty: 90 | Fill #0

## 2020-05-27 MED FILL — SYMBICORT 80-4.5 MCG INH: 80-4.5 | 30 days supply | Qty: 10 | Fill #3

## 2020-05-27 MED FILL — FLUoxetine HCL 20 MG CAPS: 20 | 90 days supply | Qty: 90 | Fill #1

## 2020-06-07 DIAGNOSIS — Z79899 Other long term (current) drug therapy: Secondary | ICD-10-CM | POA: Diagnosis not present

## 2020-06-13 MED FILL — SAXENDA 18 MG/3 ML PEN: 18 | 27 days supply | Qty: 6 | Fill #0

## 2020-06-18 MED FILL — ETODOLAC 400 MG TABS: 400 | 30 days supply | Qty: 60 | Fill #3

## 2020-07-04 ENCOUNTER — Other Ambulatory Visit (HOSPITAL_COMMUNITY): Payer: Self-pay | Admitting: Family Medicine

## 2020-07-04 MED FILL — SAXENDA 18 MG/3 ML PEN: 18 | 30 days supply | Qty: 9 | Fill #0

## 2020-07-22 MED FILL — SAXENDA 18 MG/3 ML PEN: 18 | 90 days supply | Qty: 45 | Fill #0

## 2020-07-28 MED FILL — ETODOLAC 400 MG TABS: 400 | 30 days supply | Qty: 60 | Fill #0

## 2020-07-28 MED FILL — PROGESTERONE 100 MG CAPS: 100 | 90 days supply | Qty: 90 | Fill #0

## 2020-08-05 MED FILL — buPROPion HCL ER (XL) 150 M: 150 | 90 days supply | Qty: 90 | Fill #1

## 2020-08-12 MED FILL — ESTRADIOL 0.05 MG/24HR PTTW: 0.05 | 84 days supply | Qty: 24 | Fill #1

## 2020-08-13 MED FILL — SYNTHROID 150 MCG TABLET: 150 | 84 days supply | Qty: 72 | Fill #1

## 2020-08-24 MED FILL — ETODOLAC 400 MG TABS: 400 | 30 days supply | Qty: 60 | Fill #1

## 2020-09-02 MED FILL — LEVOCETIRIZINE 5 MG TABLET: 5 | 90 days supply | Qty: 90 | Fill #0

## 2020-09-14 DIAGNOSIS — J45991 Cough variant asthma: Secondary | ICD-10-CM | POA: Diagnosis not present

## 2020-09-14 DIAGNOSIS — Z79899 Other long term (current) drug therapy: Secondary | ICD-10-CM | POA: Diagnosis not present

## 2020-09-14 DIAGNOSIS — M159 Polyosteoarthritis, unspecified: Secondary | ICD-10-CM | POA: Diagnosis not present

## 2020-09-14 DIAGNOSIS — E039 Hypothyroidism, unspecified: Secondary | ICD-10-CM | POA: Diagnosis not present

## 2020-09-14 DIAGNOSIS — M545 Low back pain: Secondary | ICD-10-CM | POA: Diagnosis not present

## 2020-09-16 ENCOUNTER — Other Ambulatory Visit (HOSPITAL_COMMUNITY): Payer: Self-pay | Admitting: Family Medicine

## 2020-09-16 DIAGNOSIS — M545 Low back pain: Secondary | ICD-10-CM | POA: Diagnosis not present

## 2020-09-16 DIAGNOSIS — E039 Hypothyroidism, unspecified: Secondary | ICD-10-CM | POA: Diagnosis not present

## 2020-09-16 DIAGNOSIS — Z23 Encounter for immunization: Secondary | ICD-10-CM | POA: Diagnosis not present

## 2020-09-16 DIAGNOSIS — Z79899 Other long term (current) drug therapy: Secondary | ICD-10-CM | POA: Diagnosis not present

## 2020-09-16 DIAGNOSIS — F3340 Major depressive disorder, recurrent, in remission, unspecified: Secondary | ICD-10-CM | POA: Diagnosis not present

## 2020-09-16 DIAGNOSIS — F419 Anxiety disorder, unspecified: Secondary | ICD-10-CM | POA: Diagnosis not present

## 2020-09-16 DIAGNOSIS — M159 Polyosteoarthritis, unspecified: Secondary | ICD-10-CM | POA: Diagnosis not present

## 2020-09-16 DIAGNOSIS — G479 Sleep disorder, unspecified: Secondary | ICD-10-CM | POA: Diagnosis not present

## 2020-09-16 DIAGNOSIS — J45991 Cough variant asthma: Secondary | ICD-10-CM | POA: Diagnosis not present

## 2020-09-16 MED FILL — FLUoxetine HCL 20 MG CAPS: 20 | 90 days supply | Qty: 90 | Fill #0

## 2020-09-16 MED FILL — SYMBICORT 80-4.5 MCG INH: 80-4.5 | 60 days supply | Qty: 10 | Fill #0

## 2020-09-17 ENCOUNTER — Other Ambulatory Visit (HOSPITAL_COMMUNITY): Payer: Self-pay | Admitting: Family Medicine

## 2020-09-20 MED FILL — ETODOLAC 400 MG TABS: 400 | 30 days supply | Qty: 60 | Fill #2

## 2020-10-14 DIAGNOSIS — H5203 Hypermetropia, bilateral: Secondary | ICD-10-CM | POA: Diagnosis not present

## 2020-10-19 ENCOUNTER — Other Ambulatory Visit (HOSPITAL_COMMUNITY): Payer: Self-pay | Admitting: Dermatology

## 2020-10-19 DIAGNOSIS — D1801 Hemangioma of skin and subcutaneous tissue: Secondary | ICD-10-CM | POA: Diagnosis not present

## 2020-10-19 DIAGNOSIS — L719 Rosacea, unspecified: Secondary | ICD-10-CM | POA: Diagnosis not present

## 2020-10-19 DIAGNOSIS — L821 Other seborrheic keratosis: Secondary | ICD-10-CM | POA: Diagnosis not present

## 2020-10-19 DIAGNOSIS — L570 Actinic keratosis: Secondary | ICD-10-CM | POA: Diagnosis not present

## 2020-10-19 MED FILL — DOXYCYCLINE HYCLATE 20 MG T: 20 | 30 days supply | Qty: 60 | Fill #0

## 2020-10-19 MED FILL — PROGESTERONE 100 MG CAPS: 100 | 90 days supply | Qty: 90 | Fill #1

## 2020-10-19 MED FILL — SAXENDA 18 MG/3 ML PEN: 18 | 30 days supply | Qty: 15 | Fill #0

## 2020-10-20 MED FILL — ETODOLAC 400 MG TABS: 400 | 30 days supply | Qty: 60 | Fill #3

## 2020-11-05 MED FILL — ESTRADIOL 0.05 MG/24HR PTTW: 0.05 | 84 days supply | Qty: 24 | Fill #2

## 2020-11-05 MED FILL — SYNTHROID 150 MCG TABLET: 150 | 42 days supply | Qty: 36 | Fill #2

## 2020-11-05 MED FILL — buPROPion HCL ER (XL) 150 M: 150 | 90 days supply | Qty: 90 | Fill #0

## 2020-11-11 DIAGNOSIS — L719 Rosacea, unspecified: Secondary | ICD-10-CM | POA: Diagnosis not present

## 2020-11-11 DIAGNOSIS — D225 Melanocytic nevi of trunk: Secondary | ICD-10-CM | POA: Diagnosis not present

## 2020-11-11 DIAGNOSIS — L821 Other seborrheic keratosis: Secondary | ICD-10-CM | POA: Diagnosis not present

## 2020-11-11 DIAGNOSIS — L723 Sebaceous cyst: Secondary | ICD-10-CM | POA: Diagnosis not present

## 2020-11-11 DIAGNOSIS — L578 Other skin changes due to chronic exposure to nonionizing radiation: Secondary | ICD-10-CM | POA: Diagnosis not present

## 2020-11-14 DIAGNOSIS — H0288B Meibomian gland dysfunction left eye, upper and lower eyelids: Secondary | ICD-10-CM | POA: Diagnosis not present

## 2020-11-14 DIAGNOSIS — H0288A Meibomian gland dysfunction right eye, upper and lower eyelids: Secondary | ICD-10-CM | POA: Diagnosis not present

## 2020-11-14 MED FILL — DOXYCYCLINE HYCLATE 20 MG T: 20 | 30 days supply | Qty: 60 | Fill #1

## 2020-11-19 ENCOUNTER — Other Ambulatory Visit: Payer: Self-pay | Admitting: Family Medicine

## 2020-11-20 ENCOUNTER — Other Ambulatory Visit: Payer: Self-pay | Admitting: Family Medicine

## 2020-11-21 MED FILL — ETODOLAC 400 MG TABS: 400 | 30 days supply | Qty: 60 | Fill #0

## 2020-11-23 MED FILL — SAXENDA 18 MG/3 ML PEN: 18 | 30 days supply | Qty: 15 | Fill #1

## 2020-11-25 MED FILL — LEVOCETIRIZINE 5 MG TABLET: 5 | 90 days supply | Qty: 90 | Fill #1

## 2020-12-12 DIAGNOSIS — Z23 Encounter for immunization: Secondary | ICD-10-CM | POA: Diagnosis not present

## 2020-12-13 MED FILL — SYNTHROID 150 MCG TABLET: 150 | 90 days supply | Qty: 80 | Fill #0

## 2020-12-13 MED FILL — DOXYCYCLINE HYCLATE 20 MG T: 20 | 30 days supply | Qty: 60 | Fill #2

## 2020-12-13 MED FILL — FLUoxetine HCL 20 MG CAPS: 20 | 90 days supply | Qty: 90 | Fill #1

## 2020-12-15 ENCOUNTER — Other Ambulatory Visit (HOSPITAL_COMMUNITY): Payer: Self-pay | Admitting: Family Medicine

## 2020-12-15 MED FILL — ETODOLAC 400 MG TABS: 400 | 30 days supply | Qty: 60 | Fill #1

## 2020-12-19 MED FILL — SAXENDA 18 MG/3 ML PEN: 18 | 30 days supply | Qty: 15 | Fill #0

## 2021-01-14 MED FILL — ETODOLAC 400 MG TABS: 400 | 30 days supply | Qty: 60 | Fill #2

## 2021-01-17 ENCOUNTER — Other Ambulatory Visit (HOSPITAL_COMMUNITY): Payer: Self-pay | Admitting: Dermatology

## 2021-01-17 DIAGNOSIS — L719 Rosacea, unspecified: Secondary | ICD-10-CM | POA: Diagnosis not present

## 2021-01-17 DIAGNOSIS — L821 Other seborrheic keratosis: Secondary | ICD-10-CM | POA: Diagnosis not present

## 2021-01-17 DIAGNOSIS — L820 Inflamed seborrheic keratosis: Secondary | ICD-10-CM | POA: Diagnosis not present

## 2021-01-17 MED FILL — DOXYCYCLINE HYCLATE 20 MG T: 20 | 90 days supply | Qty: 180 | Fill #0

## 2021-01-19 DIAGNOSIS — H5203 Hypermetropia, bilateral: Secondary | ICD-10-CM | POA: Diagnosis not present

## 2021-01-19 DIAGNOSIS — H524 Presbyopia: Secondary | ICD-10-CM | POA: Diagnosis not present

## 2021-01-25 MED FILL — SAXENDA 18 MG/3 ML PEN: 18 | 30 days supply | Qty: 15 | Fill #1

## 2021-01-27 MED FILL — PROGESTERONE 100 MG CAPS: 100 | 90 days supply | Qty: 90 | Fill #2

## 2021-01-27 MED FILL — buPROPion HCL ER (XL) 150 M: 150 | 90 days supply | Qty: 90 | Fill #1

## 2021-01-27 MED FILL — ESTRADIOL 0.05 MG/24HR PTTW: 0.05 | 84 days supply | Qty: 24 | Fill #3

## 2021-02-13 MED FILL — ETODOLAC 400 MG TABS: 400 | 30 days supply | Qty: 60 | Fill #3

## 2021-02-24 ENCOUNTER — Other Ambulatory Visit (HOSPITAL_COMMUNITY): Payer: Self-pay | Admitting: Family Medicine

## 2021-02-24 MED FILL — LEVOCETIRIZINE 5 MG TABLET: 5 | 90 days supply | Qty: 90 | Fill #0

## 2021-02-27 ENCOUNTER — Other Ambulatory Visit (HOSPITAL_COMMUNITY): Payer: Self-pay | Admitting: Family Medicine

## 2021-02-27 MED FILL — SAXENDA 18 MG/3 ML PEN: 18 | 30 days supply | Qty: 9 | Fill #1

## 2021-03-10 DIAGNOSIS — Z79899 Other long term (current) drug therapy: Secondary | ICD-10-CM | POA: Diagnosis not present

## 2021-03-10 DIAGNOSIS — E673 Hypervitaminosis D: Secondary | ICD-10-CM | POA: Diagnosis not present

## 2021-03-10 DIAGNOSIS — E039 Hypothyroidism, unspecified: Secondary | ICD-10-CM | POA: Diagnosis not present

## 2021-03-14 ENCOUNTER — Other Ambulatory Visit (HOSPITAL_BASED_OUTPATIENT_CLINIC_OR_DEPARTMENT_OTHER): Payer: Self-pay

## 2021-03-15 ENCOUNTER — Other Ambulatory Visit: Payer: Self-pay | Admitting: Family Medicine

## 2021-03-15 MED ORDER — ETODOLAC 400 MG PO TABS: ORAL_TABLET | ORAL | 3 refills | Status: DC

## 2021-03-15 MED FILL — ETODOLAC 400 MG TABS: 400 | 30 days supply | Qty: 60 | Fill #0

## 2021-03-16 ENCOUNTER — Other Ambulatory Visit: Payer: Self-pay | Admitting: Family Medicine

## 2021-03-16 DIAGNOSIS — J45991 Cough variant asthma: Secondary | ICD-10-CM | POA: Diagnosis not present

## 2021-03-16 DIAGNOSIS — G479 Sleep disorder, unspecified: Secondary | ICD-10-CM | POA: Diagnosis not present

## 2021-03-16 DIAGNOSIS — E039 Hypothyroidism, unspecified: Secondary | ICD-10-CM | POA: Diagnosis not present

## 2021-03-16 DIAGNOSIS — M5451 Vertebrogenic low back pain: Secondary | ICD-10-CM | POA: Diagnosis not present

## 2021-03-16 DIAGNOSIS — M159 Polyosteoarthritis, unspecified: Secondary | ICD-10-CM | POA: Diagnosis not present

## 2021-03-16 DIAGNOSIS — F419 Anxiety disorder, unspecified: Secondary | ICD-10-CM | POA: Diagnosis not present

## 2021-03-16 DIAGNOSIS — F3340 Major depressive disorder, recurrent, in remission, unspecified: Secondary | ICD-10-CM | POA: Diagnosis not present

## 2021-03-16 MED ORDER — ETODOLAC 400 MG PO TABS: ORAL_TABLET | ORAL | 3 refills | Status: DC

## 2021-03-17 ENCOUNTER — Other Ambulatory Visit (HOSPITAL_COMMUNITY): Payer: Self-pay | Admitting: Family Medicine

## 2021-03-18 MED FILL — SYMBICORT 80-4.5 MCG INH: 80-4.5 | 60 days supply | Qty: 10 | Fill #0

## 2021-03-18 MED FILL — FLUoxetine HCL 20 MG CAPS: 20 | 90 days supply | Qty: 90 | Fill #0

## 2021-03-18 MED FILL — SYNTHROID 150 MCG TABLET: 150 | 90 days supply | Qty: 90 | Fill #0

## 2021-03-20 ENCOUNTER — Other Ambulatory Visit (HOSPITAL_COMMUNITY): Payer: Self-pay | Admitting: Family Medicine

## 2021-03-23 MED FILL — SAXENDA 18 MG/3 ML PEN: 18 | 30 days supply | Qty: 15 | Fill #0

## 2021-04-11 ENCOUNTER — Other Ambulatory Visit (HOSPITAL_COMMUNITY): Payer: Self-pay

## 2021-04-11 MED ORDER — WEGOVY 0.25 MG/0.5ML ~~LOC~~ SOAJ
SUBCUTANEOUS | 0 refills | Status: DC
  Filled 2021-04-11 (×2): qty 2, 28d supply, fill #0

## 2021-04-11 MED ORDER — WEGOVY 0.5 MG/0.5ML ~~LOC~~ SOAJ
SUBCUTANEOUS | 0 refills | Status: DC
  Filled 2021-04-11: qty 2, 28d supply, fill #0

## 2021-04-11 MED ORDER — WEGOVY 1 MG/0.5ML ~~LOC~~ SOAJ
SUBCUTANEOUS | 0 refills | Status: DC
  Filled 2021-04-11: qty 2, 28d supply, fill #0

## 2021-04-14 ENCOUNTER — Other Ambulatory Visit (HOSPITAL_COMMUNITY): Payer: Self-pay

## 2021-04-14 MED FILL — Doxycycline Hyclate Tab 20 MG: ORAL | 90 days supply | Qty: 180 | Fill #0 | Status: AC

## 2021-04-14 MED FILL — Progesterone Cap 100 MG: ORAL | 90 days supply | Qty: 90 | Fill #0 | Status: AC

## 2021-04-15 ENCOUNTER — Other Ambulatory Visit (HOSPITAL_COMMUNITY): Payer: Self-pay

## 2021-04-17 MED FILL — Liraglutide (Weight Mngmt) Soln Pen-Inj 18 MG/3ML (6 MG/ML): SUBCUTANEOUS | 30 days supply | Qty: 15 | Fill #0 | Status: AC

## 2021-04-18 ENCOUNTER — Other Ambulatory Visit (HOSPITAL_COMMUNITY): Payer: Self-pay

## 2021-04-21 ENCOUNTER — Other Ambulatory Visit (HOSPITAL_COMMUNITY): Payer: Self-pay

## 2021-04-21 MED ORDER — BUPROPION HCL ER (XL) 150 MG PO TB24
ORAL_TABLET | ORAL | 1 refills | Status: DC
  Filled 2021-04-21: qty 90, 90d supply, fill #0
  Filled 2021-07-28: qty 90, 90d supply, fill #1

## 2021-04-25 ENCOUNTER — Other Ambulatory Visit (HOSPITAL_COMMUNITY): Payer: Self-pay | Admitting: Obstetrics and Gynecology

## 2021-04-25 ENCOUNTER — Other Ambulatory Visit (HOSPITAL_COMMUNITY): Payer: Self-pay

## 2021-04-27 ENCOUNTER — Other Ambulatory Visit (HOSPITAL_COMMUNITY): Payer: Self-pay

## 2021-04-27 MED ORDER — ESTRADIOL 0.05 MG/24HR TD PTTW
MEDICATED_PATCH | TRANSDERMAL | 3 refills | Status: AC
  Filled 2021-04-27: qty 24, 84d supply, fill #0
  Filled 2021-07-21: qty 24, 84d supply, fill #1
  Filled 2021-10-06: qty 24, 84d supply, fill #2
  Filled 2022-01-19: qty 24, 84d supply, fill #3

## 2021-05-05 ENCOUNTER — Ambulatory Visit (INDEPENDENT_AMBULATORY_CARE_PROVIDER_SITE_OTHER): Payer: 59 | Admitting: Family Medicine

## 2021-05-05 ENCOUNTER — Encounter: Payer: Self-pay | Admitting: Family Medicine

## 2021-05-05 DIAGNOSIS — N951 Menopausal and female climacteric states: Secondary | ICD-10-CM | POA: Insufficient documentation

## 2021-05-05 DIAGNOSIS — M159 Polyosteoarthritis, unspecified: Secondary | ICD-10-CM | POA: Insufficient documentation

## 2021-05-05 DIAGNOSIS — F32A Depression, unspecified: Secondary | ICD-10-CM | POA: Insufficient documentation

## 2021-05-05 DIAGNOSIS — M79641 Pain in right hand: Secondary | ICD-10-CM

## 2021-05-05 DIAGNOSIS — M19049 Primary osteoarthritis, unspecified hand: Secondary | ICD-10-CM | POA: Insufficient documentation

## 2021-05-05 DIAGNOSIS — G8929 Other chronic pain: Secondary | ICD-10-CM | POA: Insufficient documentation

## 2021-05-05 DIAGNOSIS — E039 Hypothyroidism, unspecified: Secondary | ICD-10-CM | POA: Insufficient documentation

## 2021-05-05 DIAGNOSIS — L309 Dermatitis, unspecified: Secondary | ICD-10-CM | POA: Insufficient documentation

## 2021-05-05 DIAGNOSIS — F329 Major depressive disorder, single episode, unspecified: Secondary | ICD-10-CM | POA: Insufficient documentation

## 2021-05-05 DIAGNOSIS — J45909 Unspecified asthma, uncomplicated: Secondary | ICD-10-CM | POA: Insufficient documentation

## 2021-05-05 DIAGNOSIS — E673 Hypervitaminosis D: Secondary | ICD-10-CM | POA: Insufficient documentation

## 2021-05-05 DIAGNOSIS — F3340 Major depressive disorder, recurrent, in remission, unspecified: Secondary | ICD-10-CM | POA: Insufficient documentation

## 2021-05-05 DIAGNOSIS — M720 Palmar fascial fibromatosis [Dupuytren]: Secondary | ICD-10-CM | POA: Insufficient documentation

## 2021-05-05 DIAGNOSIS — G4700 Insomnia, unspecified: Secondary | ICD-10-CM | POA: Insufficient documentation

## 2021-05-05 DIAGNOSIS — J309 Allergic rhinitis, unspecified: Secondary | ICD-10-CM | POA: Insufficient documentation

## 2021-05-05 DIAGNOSIS — F419 Anxiety disorder, unspecified: Secondary | ICD-10-CM | POA: Insufficient documentation

## 2021-05-05 DIAGNOSIS — G479 Sleep disorder, unspecified: Secondary | ICD-10-CM | POA: Insufficient documentation

## 2021-05-05 NOTE — Progress Notes (Signed)
Office Visit Note   Patient: [name]           Date of Birth: [birth year]           MRN: [id number] Visit Date: 05/05/2021 Requested by: Cari Caraway, [place],  [place] [address] PCP: Cari Caraway, MD  Subjective: Chief Complaint  Patient presents with  . Right Hand - Pain    Burning pain in the hand x a couple weeks. NKI. The pain is located between the middle and ring fingers, and only with certain movements. Right-hand dominant.    HPI: She is here with right hand pain.  No injury, symptoms started about 3 weeks ago.  Pain between the third and fourth fingers with intermittent burning sensation.  Generally it hurts when flexing her fingers and her wrist, but last night her husband was pulling on her hand and she felt a sudden pain that lasted all night.  She is right-hand dominant.  She does note that she has been doing some yard work including pruning.  Denies any neck pain or arm pain.                ROS:   All other systems were reviewed and are negative.  Objective: Vital Signs: There were no vitals taken for this visit.  Physical Exam:  General:  Alert and oriented, in no acute distress. Pulm:  Breathing unlabored. Psy:  Normal mood, congruent affect. Skin: No erythema or bruising Right hand: She is tender between the third and fourth MCP joints, and has some pain with passive manipulation of the joint.  She is able to make a full fist, has no pain with interosseous strength testing or flexion and extension of the fingers against resistance.  Negative Tinel's at the ulnar groove and carpal tunnel.  Mildly positive Phalen's test but not recreating her current symptoms.  No tenderness near the radial tunnel or in the forearm muscle belly.    Imaging: No results found.  Assessment & Plan: 1.  Right hand pain, suspect impingement of digital nerve between third and fourth MCP joints. -Trial of Voltaren gel.  Could contemplate hand therapy or  possibly ultrasound imaging if symptoms persist.     Procedures: No procedures performed        PMFS History: Patient Active Problem List   Diagnosis Date Noted  . Allergic rhinitis 05/05/2021  . Anxiety disorder 05/05/2021  . Chronic pain 05/05/2021  . Asthma 05/05/2021  . Depressive disorder 05/05/2021  . Dupuytren contracture 05/05/2021  . Eczema 05/05/2021  . Generalized osteoarthritis 05/05/2021  . Hypervitaminosis D 05/05/2021  . Hypothyroidism 05/05/2021  . Insomnia 05/05/2021  . Menopausal symptom 05/05/2021  . Primary osteoarthritis, unspecified hand 05/05/2021  . Recurrent major depression in remission ([place]) 05/05/2021  . Sleep disorder 05/05/2021  . Dupuytren's contracture of right hand 08/12/2017  . Knee pain 08/25/2013  . Personal history of colonic polyps 08/24/2013  . Metatarsalgia of left foot 01/09/2012  . 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/22/2010   Past Medical History:  Diagnosis Date  . Exercise-induced asthma   . 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 Hypothyroidism   . Seasonal allergies   . Wears glasses     History reviewed. No pertinent family history.  Past Surgical History:  Procedure Laterality Date  . BREAST LUMPECTOMY WITH RADIOACTIVE SEED LOCALIZATION Right 03/29/2015   Procedure: RIGHT BREAST LUMPECTOMY WITH RADIOACTIVE SEED LOCALIZATION;  Surgeon: Rolm Bookbinder, MD;  Location: [facility];  Service: General;  Laterality: Right;  . COLONOSCOPY    . ORIF WRIST FRACTURE  2012   right  . REPAIR PERONEAL TENDONS ANKLE  2012  . TOTAL THYROIDECTOMY  2008  . WISDOM TOOTH EXTRACTION     Social History   Occupational History  . Not on file  Tobacco Use  . Smoking status: Never Smoker  . Smokeless tobacco: Not on file  Substance and Sexual Activity  . Alcohol use: Yes    Comment: occ  . Drug use: No  . Sexual activity: Not on file

## 2021-05-20 ENCOUNTER — Other Ambulatory Visit (HOSPITAL_COMMUNITY): Payer: Self-pay

## 2021-05-20 MED FILL — Etodolac Tab 400 MG: ORAL | 30 days supply | Qty: 60 | Fill #0 | Status: AC

## 2021-05-23 ENCOUNTER — Other Ambulatory Visit (HOSPITAL_COMMUNITY): Payer: Self-pay

## 2021-05-23 MED ORDER — SAXENDA 18 MG/3ML ~~LOC~~ SOPN
PEN_INJECTOR | SUBCUTANEOUS | 0 refills | Status: AC
  Filled 2021-05-23 – 2021-07-19 (×2): qty 15, 30d supply, fill #0

## 2021-05-23 MED FILL — Liraglutide (Weight Mngmt) Soln Pen-Inj 18 MG/3ML (6 MG/ML): SUBCUTANEOUS | 30 days supply | Qty: 15 | Fill #0 | Status: AC

## 2021-05-27 ENCOUNTER — Other Ambulatory Visit (HOSPITAL_COMMUNITY): Payer: Self-pay

## 2021-05-27 MED FILL — Levocetirizine Dihydrochloride Tab 5 MG: ORAL | 90 days supply | Qty: 90 | Fill #0 | Status: AC

## 2021-05-29 ENCOUNTER — Other Ambulatory Visit (HOSPITAL_COMMUNITY): Payer: Self-pay

## 2021-06-07 MED FILL — Fluoxetine HCl Cap 20 MG: ORAL | 90 days supply | Qty: 90 | Fill #0 | Status: AC

## 2021-06-07 MED FILL — Etodolac Tab 400 MG: ORAL | 30 days supply | Qty: 60 | Fill #0 | Status: AC

## 2021-06-08 ENCOUNTER — Other Ambulatory Visit (HOSPITAL_COMMUNITY): Payer: Self-pay

## 2021-06-09 ENCOUNTER — Other Ambulatory Visit (HOSPITAL_COMMUNITY): Payer: Self-pay

## 2021-06-13 ENCOUNTER — Other Ambulatory Visit (HOSPITAL_COMMUNITY): Payer: Self-pay

## 2021-06-16 ENCOUNTER — Other Ambulatory Visit (HOSPITAL_COMMUNITY): Payer: Self-pay

## 2021-06-16 DIAGNOSIS — F3340 Major depressive disorder, recurrent, in remission, unspecified: Secondary | ICD-10-CM | POA: Diagnosis not present

## 2021-06-16 DIAGNOSIS — E559 Vitamin D deficiency, unspecified: Secondary | ICD-10-CM | POA: Diagnosis not present

## 2021-06-16 DIAGNOSIS — F419 Anxiety disorder, unspecified: Secondary | ICD-10-CM | POA: Diagnosis not present

## 2021-06-16 DIAGNOSIS — G479 Sleep disorder, unspecified: Secondary | ICD-10-CM | POA: Diagnosis not present

## 2021-06-16 DIAGNOSIS — E669 Obesity, unspecified: Secondary | ICD-10-CM | POA: Diagnosis not present

## 2021-06-16 DIAGNOSIS — Z79899 Other long term (current) drug therapy: Secondary | ICD-10-CM | POA: Diagnosis not present

## 2021-06-16 DIAGNOSIS — M159 Polyosteoarthritis, unspecified: Secondary | ICD-10-CM | POA: Diagnosis not present

## 2021-06-16 DIAGNOSIS — E039 Hypothyroidism, unspecified: Secondary | ICD-10-CM | POA: Diagnosis not present

## 2021-06-16 DIAGNOSIS — Z23 Encounter for immunization: Secondary | ICD-10-CM | POA: Diagnosis not present

## 2021-06-16 DIAGNOSIS — J45991 Cough variant asthma: Secondary | ICD-10-CM | POA: Diagnosis not present

## 2021-06-16 MED ORDER — SAXENDA 18 MG/3ML ~~LOC~~ SOPN
PEN_INJECTOR | SUBCUTANEOUS | 6 refills | Status: AC
  Filled 2021-06-16: qty 15, 30d supply, fill #0
  Filled 2021-08-18: qty 15, 30d supply, fill #1

## 2021-06-17 ENCOUNTER — Other Ambulatory Visit (HOSPITAL_COMMUNITY): Payer: Self-pay

## 2021-06-17 MED ORDER — SYNTHROID 150 MCG PO TABS
ORAL_TABLET | ORAL | 1 refills | Status: DC
  Filled 2021-06-17: qty 90, 90d supply, fill #0
  Filled 2021-09-19: qty 90, 90d supply, fill #1

## 2021-07-07 ENCOUNTER — Other Ambulatory Visit (HOSPITAL_COMMUNITY): Payer: Self-pay

## 2021-07-07 MED FILL — Doxycycline Hyclate Tab 20 MG: ORAL | 90 days supply | Qty: 180 | Fill #1 | Status: AC

## 2021-07-08 ENCOUNTER — Other Ambulatory Visit (HOSPITAL_COMMUNITY): Payer: Self-pay

## 2021-07-11 ENCOUNTER — Other Ambulatory Visit (HOSPITAL_COMMUNITY): Payer: Self-pay

## 2021-07-12 ENCOUNTER — Other Ambulatory Visit (HOSPITAL_COMMUNITY): Payer: Self-pay

## 2021-07-14 ENCOUNTER — Other Ambulatory Visit (HOSPITAL_COMMUNITY): Payer: Self-pay

## 2021-07-14 MED ORDER — PROGESTERONE MICRONIZED 100 MG PO CAPS
100.0000 mg | ORAL_CAPSULE | Freq: Every day | ORAL | 0 refills | Status: DC
  Filled 2021-07-14: qty 90, 90d supply, fill #0

## 2021-07-17 ENCOUNTER — Other Ambulatory Visit (HOSPITAL_COMMUNITY): Payer: Self-pay

## 2021-07-19 ENCOUNTER — Other Ambulatory Visit: Payer: Self-pay

## 2021-07-19 ENCOUNTER — Ambulatory Visit: Payer: 59 | Attending: Internal Medicine

## 2021-07-19 DIAGNOSIS — Z23 Encounter for immunization: Secondary | ICD-10-CM

## 2021-07-20 ENCOUNTER — Other Ambulatory Visit (HOSPITAL_BASED_OUTPATIENT_CLINIC_OR_DEPARTMENT_OTHER): Payer: Self-pay

## 2021-07-20 ENCOUNTER — Other Ambulatory Visit (HOSPITAL_COMMUNITY): Payer: Self-pay

## 2021-07-20 MED ORDER — PFIZER-BIONT COVID-19 VAC-TRIS 30 MCG/0.3ML IM SUSP
INTRAMUSCULAR | 0 refills | Status: AC
  Filled 2021-07-20: qty 0.3, 1d supply, fill #0

## 2021-07-20 NOTE — Progress Notes (Signed)
   Covid-19 Vaccination Clinic  Name:  [name]    MRN: [id number] DOB: April 09, 1961  07/20/2021  [name] was observed post Covid-19 immunization for 15 minutes without incident. She was provided with Vaccine Information Sheet and instruction to access the V-Safe system.   [name] was instructed to call 911 with any severe reactions post vaccine: Difficulty breathing  Swelling of face and throat  A fast heartbeat  A bad rash all over body  Dizziness and weakness   Immunizations Administered     Name Date Dose VIS Date Route   PFIZER Comrnaty(Gray TOP) Covid-19 Vaccine 07/19/2021  1:43 PM 0.3 mL 12/01/2020 Intramuscular   Manufacturer: [place]   Lot: [id number]   [place]: [phone]

## 2021-07-21 ENCOUNTER — Other Ambulatory Visit (HOSPITAL_COMMUNITY): Payer: Self-pay

## 2021-07-21 MED FILL — Etodolac Tab 400 MG: ORAL | 30 days supply | Qty: 60 | Fill #1 | Status: AC

## 2021-07-29 ENCOUNTER — Other Ambulatory Visit (HOSPITAL_COMMUNITY): Payer: Self-pay

## 2021-08-18 ENCOUNTER — Other Ambulatory Visit (HOSPITAL_COMMUNITY): Payer: Self-pay

## 2021-08-18 MED FILL — Etodolac Tab 400 MG: ORAL | 30 days supply | Qty: 60 | Fill #2 | Status: AC

## 2021-08-19 ENCOUNTER — Other Ambulatory Visit (HOSPITAL_COMMUNITY): Payer: Self-pay

## 2021-08-21 ENCOUNTER — Other Ambulatory Visit (HOSPITAL_COMMUNITY): Payer: Self-pay

## 2021-08-21 MED ORDER — LEVOCETIRIZINE DIHYDROCHLORIDE 5 MG PO TABS
ORAL_TABLET | ORAL | 2 refills | Status: DC
  Filled 2021-08-21: qty 90, 90d supply, fill #0
  Filled 2021-11-18: qty 90, 90d supply, fill #1
  Filled 2022-02-24: qty 90, 90d supply, fill #2

## 2021-08-24 ENCOUNTER — Other Ambulatory Visit (HOSPITAL_COMMUNITY): Payer: Self-pay

## 2021-08-24 DIAGNOSIS — Z6837 Body mass index (BMI) 37.0-37.9, adult: Secondary | ICD-10-CM | POA: Diagnosis not present

## 2021-08-24 DIAGNOSIS — Z01419 Encounter for gynecological examination (general) (routine) without abnormal findings: Secondary | ICD-10-CM | POA: Diagnosis not present

## 2021-08-24 DIAGNOSIS — Z1231 Encounter for screening mammogram for malignant neoplasm of breast: Secondary | ICD-10-CM | POA: Diagnosis not present

## 2021-08-24 MED ORDER — ESTRADIOL 0.05 MG/24HR TD PTTW
1.0000 | MEDICATED_PATCH | TRANSDERMAL | 3 refills | Status: AC
  Filled 2021-08-24 – 2022-01-08 (×4): qty 24, 84d supply, fill #0
  Filled 2022-04-12: qty 24, 84d supply, fill #1
  Filled 2022-07-13: qty 24, 84d supply, fill #2

## 2021-08-24 MED ORDER — CYCLOBENZAPRINE HCL 10 MG PO TABS
ORAL_TABLET | ORAL | 3 refills | Status: AC
  Filled 2021-08-24: qty 30, 30d supply, fill #0
  Filled 2021-12-30: qty 30, 30d supply, fill #1

## 2021-08-24 MED ORDER — PROGESTERONE MICRONIZED 100 MG PO CAPS
100.0000 mg | ORAL_CAPSULE | Freq: Every day | ORAL | 3 refills | Status: DC
  Filled 2021-08-24 – 2021-10-06 (×2): qty 90, 90d supply, fill #0
  Filled 2022-01-05: qty 90, 90d supply, fill #1
  Filled 2022-04-15: qty 90, 90d supply, fill #2
  Filled 2022-07-13: qty 90, 90d supply, fill #3

## 2021-08-29 ENCOUNTER — Other Ambulatory Visit: Payer: Self-pay | Admitting: Obstetrics and Gynecology

## 2021-08-29 DIAGNOSIS — R928 Other abnormal and inconclusive findings on diagnostic imaging of breast: Secondary | ICD-10-CM

## 2021-09-08 ENCOUNTER — Other Ambulatory Visit (HOSPITAL_COMMUNITY): Payer: Self-pay

## 2021-09-08 MED ORDER — FLUOXETINE HCL 20 MG PO CAPS
ORAL_CAPSULE | ORAL | 1 refills | Status: DC
  Filled 2021-09-08: qty 90, 90d supply, fill #0
  Filled 2021-12-01: qty 90, 90d supply, fill #1

## 2021-09-11 ENCOUNTER — Other Ambulatory Visit (HOSPITAL_COMMUNITY): Payer: Self-pay

## 2021-09-14 ENCOUNTER — Other Ambulatory Visit (HOSPITAL_COMMUNITY): Payer: Self-pay

## 2021-09-14 MED FILL — Budesonide-Formoterol Fumarate Dihyd Aerosol 80-4.5 MCG/ACT: RESPIRATORY_TRACT | 60 days supply | Qty: 10.2 | Fill #0 | Status: AC

## 2021-09-15 ENCOUNTER — Ambulatory Visit
Admission: RE | Admit: 2021-09-15 | Discharge: 2021-09-15 | Disposition: A | Discharge status: Home / Self Care | Payer: 59 | Source: Ambulatory Visit | Attending: Obstetrics and Gynecology | Admitting: Obstetrics and Gynecology

## 2021-09-15 ENCOUNTER — Other Ambulatory Visit: Payer: Self-pay

## 2021-09-15 DIAGNOSIS — N6012 Diffuse cystic mastopathy of left breast: Secondary | ICD-10-CM | POA: Diagnosis not present

## 2021-09-15 DIAGNOSIS — R928 Other abnormal and inconclusive findings on diagnostic imaging of breast: Secondary | ICD-10-CM

## 2021-09-15 DIAGNOSIS — R922 Inconclusive mammogram: Secondary | ICD-10-CM | POA: Diagnosis not present

## 2021-09-15 DIAGNOSIS — N6011 Diffuse cystic mastopathy of right breast: Secondary | ICD-10-CM | POA: Diagnosis not present

## 2021-09-19 ENCOUNTER — Other Ambulatory Visit (HOSPITAL_COMMUNITY): Payer: Self-pay

## 2021-09-19 MED ORDER — OZEMPIC (0.25 OR 0.5 MG/DOSE) 2 MG/1.5ML ~~LOC~~ SOPN
PEN_INJECTOR | SUBCUTANEOUS | 0 refills | Status: AC
  Filled 2021-11-04: qty 1.5, 28d supply, fill #0

## 2021-09-19 MED ORDER — OZEMPIC (0.25 OR 0.5 MG/DOSE) 2 MG/1.5ML ~~LOC~~ SOPN
PEN_INJECTOR | SUBCUTANEOUS | 0 refills | Status: AC
  Filled 2021-09-19: qty 1.5, 42d supply, fill #0

## 2021-09-19 MED ORDER — OZEMPIC (1 MG/DOSE) 4 MG/3ML ~~LOC~~ SOPN
PEN_INJECTOR | SUBCUTANEOUS | 1 refills | Status: AC
  Filled 2021-11-04: qty 3, 28d supply, fill #0
  Filled 2021-11-18: qty 3, 30d supply, fill #0
  Filled 2021-12-01: qty 3, 28d supply, fill #1
  Filled 2021-12-12: qty 3, 30d supply, fill #1
  Filled 2022-01-08: qty 3, 30d supply, fill #2

## 2021-09-19 MED ORDER — SYNTHROID 150 MCG PO TABS: ORAL_TABLET | ORAL | 0 refills | Status: DC

## 2021-09-19 MED FILL — Etodolac Tab 400 MG: ORAL | 30 days supply | Qty: 60 | Fill #3 | Status: AC

## 2021-09-20 ENCOUNTER — Other Ambulatory Visit (HOSPITAL_COMMUNITY): Payer: Self-pay

## 2021-10-06 ENCOUNTER — Other Ambulatory Visit (HOSPITAL_COMMUNITY): Payer: Self-pay

## 2021-10-06 MED FILL — Doxycycline Hyclate Tab 20 MG: ORAL | 10 days supply | Qty: 20 | Fill #2 | Status: AC

## 2021-10-07 ENCOUNTER — Other Ambulatory Visit (HOSPITAL_COMMUNITY): Payer: Self-pay

## 2021-10-07 MED FILL — Doxycycline Hyclate Tab 20 MG: ORAL | 80 days supply | Qty: 160 | Fill #2 | Status: AC

## 2021-10-09 ENCOUNTER — Other Ambulatory Visit (HOSPITAL_COMMUNITY): Payer: Self-pay

## 2021-10-20 ENCOUNTER — Other Ambulatory Visit: Payer: Self-pay

## 2021-10-20 ENCOUNTER — Other Ambulatory Visit (HOSPITAL_COMMUNITY): Payer: Self-pay

## 2021-10-23 ENCOUNTER — Other Ambulatory Visit (HOSPITAL_COMMUNITY): Payer: Self-pay

## 2021-10-23 MED ORDER — BUPROPION HCL ER (XL) 150 MG PO TB24
150.0000 mg | ORAL_TABLET | Freq: Every morning | ORAL | 1 refills | Status: AC
  Filled 2021-10-23: qty 90, 90d supply, fill #0
  Filled 2022-08-25: qty 90, 90d supply, fill #1

## 2021-10-24 ENCOUNTER — Other Ambulatory Visit (HOSPITAL_COMMUNITY): Payer: Self-pay

## 2021-10-24 MED FILL — Etodolac Tab 400 MG: ORAL | 30 days supply | Qty: 60 | Fill #1 | Status: AC

## 2021-11-04 ENCOUNTER — Other Ambulatory Visit (HOSPITAL_COMMUNITY): Payer: Self-pay

## 2021-11-15 ENCOUNTER — Other Ambulatory Visit (HOSPITAL_COMMUNITY): Payer: Self-pay

## 2021-11-15 MED ORDER — CARESTART COVID-19 HOME TEST VI KIT
PACK | 0 refills | Status: AC
  Filled 2021-11-15: qty 4, 1d supply, fill #0

## 2021-11-18 ENCOUNTER — Other Ambulatory Visit: Payer: Self-pay

## 2021-11-18 ENCOUNTER — Other Ambulatory Visit (HOSPITAL_COMMUNITY): Payer: Self-pay

## 2021-11-21 ENCOUNTER — Other Ambulatory Visit (HOSPITAL_COMMUNITY): Payer: Self-pay

## 2021-11-23 ENCOUNTER — Other Ambulatory Visit (HOSPITAL_COMMUNITY): Payer: Self-pay

## 2021-11-23 DIAGNOSIS — L720 Epidermal cyst: Secondary | ICD-10-CM | POA: Diagnosis not present

## 2021-11-23 DIAGNOSIS — L578 Other skin changes due to chronic exposure to nonionizing radiation: Secondary | ICD-10-CM | POA: Diagnosis not present

## 2021-11-23 DIAGNOSIS — L719 Rosacea, unspecified: Secondary | ICD-10-CM | POA: Diagnosis not present

## 2021-11-23 DIAGNOSIS — D225 Melanocytic nevi of trunk: Secondary | ICD-10-CM | POA: Diagnosis not present

## 2021-11-23 DIAGNOSIS — L821 Other seborrheic keratosis: Secondary | ICD-10-CM | POA: Diagnosis not present

## 2021-11-23 DIAGNOSIS — D2371 Other benign neoplasm of skin of right lower limb, including hip: Secondary | ICD-10-CM | POA: Diagnosis not present

## 2021-11-23 MED ORDER — DOXYCYCLINE HYCLATE 20 MG PO TABS
20.0000 mg | ORAL_TABLET | ORAL | 4 refills | Status: AC
  Filled 2021-11-23 – 2022-01-05 (×2): qty 180, 90d supply, fill #0
  Filled 2022-04-15: qty 180, 90d supply, fill #1
  Filled 2022-07-13: qty 180, 90d supply, fill #2

## 2021-11-23 MED FILL — Etodolac Tab 400 MG: ORAL | 30 days supply | Qty: 60 | Fill #2 | Status: AC

## 2021-12-01 ENCOUNTER — Other Ambulatory Visit: Payer: Self-pay

## 2021-12-01 MED FILL — Budesonide-Formoterol Fumarate Dihyd Aerosol 80-4.5 MCG/ACT: RESPIRATORY_TRACT | 60 days supply | Qty: 10.2 | Fill #1 | Status: AC

## 2021-12-02 ENCOUNTER — Other Ambulatory Visit (HOSPITAL_COMMUNITY): Payer: Self-pay

## 2021-12-04 ENCOUNTER — Other Ambulatory Visit (HOSPITAL_COMMUNITY): Payer: Self-pay

## 2021-12-04 ENCOUNTER — Other Ambulatory Visit: Payer: Self-pay

## 2021-12-08 ENCOUNTER — Other Ambulatory Visit (HOSPITAL_COMMUNITY): Payer: Self-pay

## 2021-12-11 ENCOUNTER — Other Ambulatory Visit (HOSPITAL_COMMUNITY): Payer: Self-pay

## 2021-12-12 ENCOUNTER — Other Ambulatory Visit (HOSPITAL_COMMUNITY): Payer: Self-pay

## 2021-12-15 DIAGNOSIS — E039 Hypothyroidism, unspecified: Secondary | ICD-10-CM | POA: Diagnosis not present

## 2021-12-15 DIAGNOSIS — E559 Vitamin D deficiency, unspecified: Secondary | ICD-10-CM | POA: Diagnosis not present

## 2021-12-15 DIAGNOSIS — Z79899 Other long term (current) drug therapy: Secondary | ICD-10-CM | POA: Diagnosis not present

## 2021-12-28 ENCOUNTER — Other Ambulatory Visit (HOSPITAL_COMMUNITY): Payer: Self-pay

## 2021-12-28 DIAGNOSIS — F3340 Major depressive disorder, recurrent, in remission, unspecified: Secondary | ICD-10-CM | POA: Diagnosis not present

## 2021-12-28 DIAGNOSIS — G4719 Other hypersomnia: Secondary | ICD-10-CM | POA: Diagnosis not present

## 2021-12-28 DIAGNOSIS — Z23 Encounter for immunization: Secondary | ICD-10-CM | POA: Diagnosis not present

## 2021-12-28 DIAGNOSIS — F419 Anxiety disorder, unspecified: Secondary | ICD-10-CM | POA: Diagnosis not present

## 2021-12-28 DIAGNOSIS — M5442 Lumbago with sciatica, left side: Secondary | ICD-10-CM | POA: Diagnosis not present

## 2021-12-28 DIAGNOSIS — L309 Dermatitis, unspecified: Secondary | ICD-10-CM | POA: Diagnosis not present

## 2021-12-28 DIAGNOSIS — J45991 Cough variant asthma: Secondary | ICD-10-CM | POA: Diagnosis not present

## 2021-12-28 DIAGNOSIS — E039 Hypothyroidism, unspecified: Secondary | ICD-10-CM | POA: Diagnosis not present

## 2021-12-28 DIAGNOSIS — G8929 Other chronic pain: Secondary | ICD-10-CM | POA: Diagnosis not present

## 2021-12-28 MED ORDER — BUDESONIDE-FORMOTEROL FUMARATE 80-4.5 MCG/ACT IN AERO
INHALATION_SPRAY | RESPIRATORY_TRACT | 1 refills | Status: AC
  Filled 2021-12-28: qty 10.2, 30d supply, fill #0
  Filled 2022-03-06: qty 10.2, 60d supply, fill #0
  Filled 2022-05-24: qty 10.2, 60d supply, fill #1

## 2021-12-28 MED ORDER — BUPROPION HCL ER (XL) 150 MG PO TB24
150.0000 mg | ORAL_TABLET | Freq: Every morning | ORAL | 1 refills | Status: AC
  Filled 2021-12-28 – 2022-01-05 (×2): qty 90, 90d supply, fill #0
  Filled 2022-04-15: qty 90, 90d supply, fill #1

## 2021-12-28 MED ORDER — OZEMPIC (1 MG/DOSE) 4 MG/3ML ~~LOC~~ SOPN
PEN_INJECTOR | SUBCUTANEOUS | 5 refills | Status: AC
  Filled 2021-12-28: qty 9, 84d supply, fill #0
  Filled 2022-01-05: qty 3, 28d supply, fill #0

## 2021-12-28 MED ORDER — FLUOXETINE HCL 20 MG PO CAPS
20.0000 mg | ORAL_CAPSULE | Freq: Every day | ORAL | 1 refills | Status: DC
  Filled 2021-12-28 – 2022-03-03 (×2): qty 90, 90d supply, fill #0
  Filled 2022-06-07: qty 90, 90d supply, fill #1

## 2021-12-28 MED ORDER — SYNTHROID 150 MCG PO TABS
ORAL_TABLET | ORAL | 1 refills | Status: DC
  Filled 2021-12-28: qty 85, 90d supply, fill #0
  Filled 2022-01-05 – 2022-04-12 (×2): qty 85, 90d supply, fill #1

## 2021-12-28 MED ORDER — ETODOLAC 400 MG PO TABS
400.0000 mg | ORAL_TABLET | Freq: Two times a day (BID) | ORAL | 1 refills | Status: DC
  Filled 2021-12-28: qty 180, 90d supply, fill #0
  Filled 2022-03-17: qty 180, 90d supply, fill #1

## 2021-12-29 ENCOUNTER — Other Ambulatory Visit (HOSPITAL_COMMUNITY): Payer: Self-pay

## 2021-12-29 MED ORDER — TRAMADOL HCL 50 MG PO TABS
ORAL_TABLET | ORAL | 0 refills | Status: AC
  Filled 2021-12-29: qty 30, 5d supply, fill #0

## 2021-12-30 ENCOUNTER — Other Ambulatory Visit (HOSPITAL_COMMUNITY): Payer: Self-pay

## 2022-01-05 ENCOUNTER — Other Ambulatory Visit (HOSPITAL_COMMUNITY): Payer: Self-pay

## 2022-01-06 ENCOUNTER — Other Ambulatory Visit (HOSPITAL_COMMUNITY): Payer: Self-pay

## 2022-01-08 ENCOUNTER — Other Ambulatory Visit (HOSPITAL_COMMUNITY): Payer: Self-pay

## 2022-01-09 ENCOUNTER — Other Ambulatory Visit (HOSPITAL_COMMUNITY): Payer: Self-pay

## 2022-01-19 ENCOUNTER — Other Ambulatory Visit (HOSPITAL_COMMUNITY): Payer: Self-pay

## 2022-01-23 DIAGNOSIS — H5203 Hypermetropia, bilateral: Secondary | ICD-10-CM | POA: Diagnosis not present

## 2022-01-24 ENCOUNTER — Other Ambulatory Visit (HOSPITAL_COMMUNITY): Payer: Self-pay

## 2022-01-24 MED ORDER — WEGOVY 1 MG/0.5ML ~~LOC~~ SOAJ
SUBCUTANEOUS | 3 refills | Status: DC
Start: 2022-01-24 — End: 2023-03-14
  Filled 2022-01-24: qty 2, 28d supply, fill #0

## 2022-01-25 ENCOUNTER — Other Ambulatory Visit (HOSPITAL_COMMUNITY): Payer: Self-pay

## 2022-01-25 MED ORDER — WEGOVY 1.7 MG/0.75ML ~~LOC~~ SOAJ
SUBCUTANEOUS | 3 refills | Status: DC
  Filled 2022-01-25: qty 3, 28d supply, fill #0
  Filled 2022-03-15: qty 3, 28d supply, fill #1

## 2022-01-29 ENCOUNTER — Other Ambulatory Visit (HOSPITAL_COMMUNITY): Payer: Self-pay

## 2022-01-30 ENCOUNTER — Other Ambulatory Visit (HOSPITAL_COMMUNITY): Payer: Self-pay

## 2022-02-01 ENCOUNTER — Other Ambulatory Visit (HOSPITAL_COMMUNITY): Payer: Self-pay

## 2022-02-05 ENCOUNTER — Other Ambulatory Visit (HOSPITAL_COMMUNITY): Payer: Self-pay

## 2022-02-23 ENCOUNTER — Other Ambulatory Visit (HOSPITAL_COMMUNITY): Payer: Self-pay

## 2022-02-24 ENCOUNTER — Other Ambulatory Visit (HOSPITAL_COMMUNITY): Payer: Self-pay

## 2022-03-02 DIAGNOSIS — E039 Hypothyroidism, unspecified: Secondary | ICD-10-CM | POA: Diagnosis not present

## 2022-03-02 DIAGNOSIS — G4719 Other hypersomnia: Secondary | ICD-10-CM | POA: Diagnosis not present

## 2022-03-02 DIAGNOSIS — E669 Obesity, unspecified: Secondary | ICD-10-CM | POA: Diagnosis not present

## 2022-03-03 ENCOUNTER — Other Ambulatory Visit (HOSPITAL_COMMUNITY): Payer: Self-pay

## 2022-03-06 ENCOUNTER — Other Ambulatory Visit (HOSPITAL_COMMUNITY): Payer: Self-pay

## 2022-03-15 ENCOUNTER — Other Ambulatory Visit (HOSPITAL_COMMUNITY): Payer: Self-pay

## 2022-03-16 ENCOUNTER — Other Ambulatory Visit (HOSPITAL_COMMUNITY): Payer: Self-pay

## 2022-03-18 ENCOUNTER — Other Ambulatory Visit (HOSPITAL_COMMUNITY): Payer: Self-pay

## 2022-03-19 ENCOUNTER — Other Ambulatory Visit (HOSPITAL_COMMUNITY): Payer: Self-pay

## 2022-03-20 ENCOUNTER — Other Ambulatory Visit (HOSPITAL_COMMUNITY): Payer: Self-pay

## 2022-04-09 ENCOUNTER — Other Ambulatory Visit (HOSPITAL_COMMUNITY): Payer: Self-pay

## 2022-04-09 MED ORDER — WEGOVY 2.4 MG/0.75ML ~~LOC~~ SOAJ
SUBCUTANEOUS | 5 refills | Status: DC
Start: 2022-04-07 — End: 2023-03-14
  Filled 2022-04-09: qty 3, 28d supply, fill #0
  Filled 2022-05-11: qty 3, 28d supply, fill #1
  Filled 2022-06-07: qty 3, 28d supply, fill #2
  Filled 2022-08-09 (×2): qty 3, 28d supply, fill #3
  Filled 2022-10-28: qty 3, 28d supply, fill #4

## 2022-04-12 ENCOUNTER — Other Ambulatory Visit (HOSPITAL_COMMUNITY): Payer: Self-pay

## 2022-04-16 ENCOUNTER — Other Ambulatory Visit (HOSPITAL_COMMUNITY): Payer: Self-pay

## 2022-04-16 DIAGNOSIS — G4733 Obstructive sleep apnea (adult) (pediatric): Secondary | ICD-10-CM | POA: Diagnosis not present

## 2022-04-17 ENCOUNTER — Other Ambulatory Visit (HOSPITAL_COMMUNITY): Payer: Self-pay

## 2022-04-20 DIAGNOSIS — G4733 Obstructive sleep apnea (adult) (pediatric): Secondary | ICD-10-CM | POA: Diagnosis not present

## 2022-04-20 DIAGNOSIS — E669 Obesity, unspecified: Secondary | ICD-10-CM | POA: Diagnosis not present

## 2022-04-20 DIAGNOSIS — E039 Hypothyroidism, unspecified: Secondary | ICD-10-CM | POA: Diagnosis not present

## 2022-04-29 ENCOUNTER — Other Ambulatory Visit (HOSPITAL_COMMUNITY): Payer: Self-pay

## 2022-04-30 ENCOUNTER — Other Ambulatory Visit (HOSPITAL_COMMUNITY): Payer: Self-pay

## 2022-04-30 MED ORDER — LEVOCETIRIZINE DIHYDROCHLORIDE 5 MG PO TABS
5.0000 mg | ORAL_TABLET | Freq: Every day | ORAL | 2 refills | Status: AC
  Filled 2022-04-30: qty 90, 90d supply, fill #0
  Filled 2022-06-07: qty 90, 90d supply, fill #1
  Filled 2022-11-24: qty 90, 90d supply, fill #2

## 2022-05-11 ENCOUNTER — Other Ambulatory Visit (HOSPITAL_COMMUNITY): Payer: Self-pay

## 2022-05-11 DIAGNOSIS — G4733 Obstructive sleep apnea (adult) (pediatric): Secondary | ICD-10-CM | POA: Diagnosis not present

## 2022-05-14 ENCOUNTER — Other Ambulatory Visit (HOSPITAL_COMMUNITY): Payer: Self-pay

## 2022-05-24 ENCOUNTER — Other Ambulatory Visit (HOSPITAL_COMMUNITY): Payer: Self-pay

## 2022-06-07 ENCOUNTER — Other Ambulatory Visit (HOSPITAL_COMMUNITY): Payer: Self-pay

## 2022-06-11 DIAGNOSIS — G4733 Obstructive sleep apnea (adult) (pediatric): Secondary | ICD-10-CM | POA: Diagnosis not present

## 2022-06-15 ENCOUNTER — Other Ambulatory Visit (HOSPITAL_COMMUNITY): Payer: Self-pay

## 2022-06-15 MED ORDER — ETODOLAC 400 MG PO TABS
ORAL_TABLET | ORAL | 1 refills | Status: DC
  Filled 2022-06-15: qty 180, 90d supply, fill #0
  Filled 2022-11-24 – 2022-12-18 (×3): qty 180, 90d supply, fill #1

## 2022-06-18 ENCOUNTER — Other Ambulatory Visit (HOSPITAL_COMMUNITY): Payer: Self-pay

## 2022-06-19 ENCOUNTER — Other Ambulatory Visit (HOSPITAL_COMMUNITY): Payer: Self-pay

## 2022-07-06 DIAGNOSIS — Z79899 Other long term (current) drug therapy: Secondary | ICD-10-CM | POA: Diagnosis not present

## 2022-07-06 DIAGNOSIS — E039 Hypothyroidism, unspecified: Secondary | ICD-10-CM | POA: Diagnosis not present

## 2022-07-06 DIAGNOSIS — G4733 Obstructive sleep apnea (adult) (pediatric): Secondary | ICD-10-CM | POA: Diagnosis not present

## 2022-07-06 DIAGNOSIS — E669 Obesity, unspecified: Secondary | ICD-10-CM | POA: Diagnosis not present

## 2022-07-11 DIAGNOSIS — G4733 Obstructive sleep apnea (adult) (pediatric): Secondary | ICD-10-CM | POA: Diagnosis not present

## 2022-07-13 ENCOUNTER — Other Ambulatory Visit (HOSPITAL_COMMUNITY): Payer: Self-pay

## 2022-07-13 DIAGNOSIS — Z13220 Encounter for screening for lipoid disorders: Secondary | ICD-10-CM | POA: Diagnosis not present

## 2022-07-13 DIAGNOSIS — E669 Obesity, unspecified: Secondary | ICD-10-CM | POA: Diagnosis not present

## 2022-07-13 DIAGNOSIS — J309 Allergic rhinitis, unspecified: Secondary | ICD-10-CM | POA: Diagnosis not present

## 2022-07-13 DIAGNOSIS — J45991 Cough variant asthma: Secondary | ICD-10-CM | POA: Diagnosis not present

## 2022-07-13 DIAGNOSIS — Z6832 Body mass index (BMI) 32.0-32.9, adult: Secondary | ICD-10-CM | POA: Diagnosis not present

## 2022-07-13 DIAGNOSIS — M159 Polyosteoarthritis, unspecified: Secondary | ICD-10-CM | POA: Diagnosis not present

## 2022-07-13 DIAGNOSIS — E039 Hypothyroidism, unspecified: Secondary | ICD-10-CM | POA: Diagnosis not present

## 2022-07-13 DIAGNOSIS — F3340 Major depressive disorder, recurrent, in remission, unspecified: Secondary | ICD-10-CM | POA: Diagnosis not present

## 2022-07-13 DIAGNOSIS — Z79899 Other long term (current) drug therapy: Secondary | ICD-10-CM | POA: Diagnosis not present

## 2022-07-13 MED ORDER — LEVOTHYROXINE SODIUM 150 MCG PO TABS
ORAL_TABLET | ORAL | 3 refills | Status: DC
  Filled 2022-07-13: qty 78, 90d supply, fill #0
  Filled 2022-09-28: qty 78, 90d supply, fill #1
  Filled 2023-02-27: qty 78, 90d supply, fill #2

## 2022-07-13 MED ORDER — FLUOXETINE HCL 20 MG PO CAPS
ORAL_CAPSULE | ORAL | 3 refills | Status: DC
  Filled 2022-07-13 – 2022-08-25 (×2): qty 90, 90d supply, fill #0
  Filled 2022-11-24: qty 90, 90d supply, fill #1
  Filled 2023-02-25: qty 90, 90d supply, fill #2

## 2022-07-13 MED ORDER — LEVOCETIRIZINE DIHYDROCHLORIDE 5 MG PO TABS
ORAL_TABLET | ORAL | 1 refills | Status: DC
  Filled 2022-07-13 – 2022-08-25 (×2): qty 90, 90d supply, fill #0
  Filled 2022-11-24 – 2023-02-25 (×3): qty 90, 90d supply, fill #1

## 2022-07-13 MED ORDER — BUPROPION HCL ER (XL) 150 MG PO TB24
ORAL_TABLET | ORAL | 3 refills | Status: DC
  Filled 2022-07-13: qty 90, 90d supply, fill #0
  Filled 2022-09-28: qty 90, 90d supply, fill #1
  Filled 2023-01-04 (×2): qty 90, 90d supply, fill #2
  Filled 2023-04-06: qty 90, 90d supply, fill #3

## 2022-07-13 MED ORDER — WEGOVY 2.4 MG/0.75ML ~~LOC~~ SOAJ
SUBCUTANEOUS | 3 refills | Status: DC
  Filled 2022-07-13: qty 3, 28d supply, fill #0
  Filled 2022-08-25: qty 3, 28d supply, fill #1
  Filled 2022-09-28: qty 3, 28d supply, fill #2
  Filled 2022-10-28 – 2022-11-24 (×2): qty 3, 28d supply, fill #3
  Filled 2022-12-19: qty 3, 28d supply, fill #4

## 2022-07-13 MED ORDER — BUDESONIDE-FORMOTEROL FUMARATE 80-4.5 MCG/ACT IN AERO
INHALATION_SPRAY | RESPIRATORY_TRACT | 3 refills | Status: AC
  Filled 2022-07-13: qty 10.2, 60d supply, fill #0
  Filled 2022-11-12: qty 10.2, 60d supply, fill #1

## 2022-07-13 MED ORDER — ETODOLAC 400 MG PO TABS
400.0000 mg | ORAL_TABLET | Freq: Two times a day (BID) | ORAL | 1 refills | Status: DC
  Filled 2022-07-13 – 2022-09-07 (×2): qty 180, 90d supply, fill #0
  Filled 2022-12-18 (×3): qty 180, 90d supply, fill #1

## 2022-07-14 ENCOUNTER — Other Ambulatory Visit (HOSPITAL_COMMUNITY): Payer: Self-pay

## 2022-07-16 ENCOUNTER — Other Ambulatory Visit (HOSPITAL_COMMUNITY): Payer: Self-pay

## 2022-08-09 ENCOUNTER — Other Ambulatory Visit (HOSPITAL_COMMUNITY): Payer: Self-pay

## 2022-08-10 ENCOUNTER — Other Ambulatory Visit (HOSPITAL_COMMUNITY): Payer: Self-pay

## 2022-08-11 DIAGNOSIS — G4733 Obstructive sleep apnea (adult) (pediatric): Secondary | ICD-10-CM | POA: Diagnosis not present

## 2022-08-18 DIAGNOSIS — G4733 Obstructive sleep apnea (adult) (pediatric): Secondary | ICD-10-CM | POA: Diagnosis not present

## 2022-08-27 ENCOUNTER — Other Ambulatory Visit (HOSPITAL_COMMUNITY): Payer: Self-pay

## 2022-08-28 ENCOUNTER — Other Ambulatory Visit (HOSPITAL_COMMUNITY): Payer: Self-pay

## 2022-08-31 ENCOUNTER — Other Ambulatory Visit (HOSPITAL_COMMUNITY): Payer: Self-pay

## 2022-09-07 ENCOUNTER — Other Ambulatory Visit (HOSPITAL_COMMUNITY): Payer: Self-pay

## 2022-09-07 DIAGNOSIS — Z760 Encounter for issue of repeat prescription: Secondary | ICD-10-CM | POA: Diagnosis not present

## 2022-09-07 DIAGNOSIS — Z01419 Encounter for gynecological examination (general) (routine) without abnormal findings: Secondary | ICD-10-CM | POA: Diagnosis not present

## 2022-09-07 DIAGNOSIS — M791 Myalgia, unspecified site: Secondary | ICD-10-CM | POA: Diagnosis not present

## 2022-09-07 DIAGNOSIS — Z6833 Body mass index (BMI) 33.0-33.9, adult: Secondary | ICD-10-CM | POA: Diagnosis not present

## 2022-09-07 DIAGNOSIS — N951 Menopausal and female climacteric states: Secondary | ICD-10-CM | POA: Diagnosis not present

## 2022-09-07 DIAGNOSIS — Z1231 Encounter for screening mammogram for malignant neoplasm of breast: Secondary | ICD-10-CM | POA: Diagnosis not present

## 2022-09-07 MED ORDER — CYCLOBENZAPRINE HCL 10 MG PO TABS
10.0000 mg | ORAL_TABLET | ORAL | 3 refills | Status: AC | PRN
  Filled 2022-09-07: qty 30, 30d supply, fill #0

## 2022-09-07 MED ORDER — ESTRADIOL 0.05 MG/24HR TD PTTW
1.0000 | MEDICATED_PATCH | TRANSDERMAL | 3 refills | Status: DC
  Filled 2022-09-07 – 2022-09-28 (×2): qty 24, 84d supply, fill #0
  Filled 2023-01-04 (×2): qty 24, 84d supply, fill #1
  Filled 2023-03-27: qty 24, 84d supply, fill #2
  Filled 2023-07-02: qty 24, 84d supply, fill #3

## 2022-09-07 MED ORDER — PROGESTERONE MICRONIZED 100 MG PO CAPS
100.0000 mg | ORAL_CAPSULE | Freq: Every day | ORAL | 3 refills | Status: AC
  Filled 2022-09-28: qty 90, 90d supply, fill #0
  Filled 2023-01-04 (×2): qty 90, 90d supply, fill #1
  Filled 2023-04-06: qty 90, 90d supply, fill #2
  Filled 2023-07-15: qty 90, 90d supply, fill #3

## 2022-09-10 ENCOUNTER — Other Ambulatory Visit (HOSPITAL_COMMUNITY): Payer: Self-pay

## 2022-09-11 ENCOUNTER — Other Ambulatory Visit: Payer: Self-pay | Admitting: Obstetrics and Gynecology

## 2022-09-11 DIAGNOSIS — R928 Other abnormal and inconclusive findings on diagnostic imaging of breast: Secondary | ICD-10-CM

## 2022-09-12 ENCOUNTER — Other Ambulatory Visit (HOSPITAL_COMMUNITY): Payer: Self-pay

## 2022-09-12 DIAGNOSIS — E669 Obesity, unspecified: Secondary | ICD-10-CM | POA: Diagnosis not present

## 2022-09-12 DIAGNOSIS — F3341 Major depressive disorder, recurrent, in partial remission: Secondary | ICD-10-CM | POA: Diagnosis not present

## 2022-09-12 DIAGNOSIS — Z1389 Encounter for screening for other disorder: Secondary | ICD-10-CM | POA: Diagnosis not present

## 2022-09-12 DIAGNOSIS — R5383 Other fatigue: Secondary | ICD-10-CM | POA: Diagnosis not present

## 2022-09-12 DIAGNOSIS — Z6833 Body mass index (BMI) 33.0-33.9, adult: Secondary | ICD-10-CM | POA: Diagnosis not present

## 2022-09-12 DIAGNOSIS — E8889 Other specified metabolic disorders: Secondary | ICD-10-CM | POA: Diagnosis not present

## 2022-09-12 DIAGNOSIS — G4733 Obstructive sleep apnea (adult) (pediatric): Secondary | ICD-10-CM | POA: Diagnosis not present

## 2022-09-12 DIAGNOSIS — Z9189 Other specified personal risk factors, not elsewhere classified: Secondary | ICD-10-CM | POA: Diagnosis not present

## 2022-09-12 MED ORDER — PHENTERMINE HCL 37.5 MG PO TABS
18.7500 mg | ORAL_TABLET | Freq: Every day | ORAL | 0 refills | Status: DC
  Filled 2022-09-12: qty 30, 30d supply, fill #0

## 2022-09-26 ENCOUNTER — Ambulatory Visit
Admission: RE | Admit: 2022-09-26 | Discharge: 2022-09-26 | Disposition: A | Discharge status: Home / Self Care | Payer: 59 | Source: Ambulatory Visit | Attending: Obstetrics and Gynecology | Admitting: Obstetrics and Gynecology

## 2022-09-26 DIAGNOSIS — R928 Other abnormal and inconclusive findings on diagnostic imaging of breast: Secondary | ICD-10-CM | POA: Diagnosis not present

## 2022-09-26 DIAGNOSIS — F419 Anxiety disorder, unspecified: Secondary | ICD-10-CM | POA: Diagnosis not present

## 2022-09-26 DIAGNOSIS — N6011 Diffuse cystic mastopathy of right breast: Secondary | ICD-10-CM | POA: Diagnosis not present

## 2022-09-26 DIAGNOSIS — Z9189 Other specified personal risk factors, not elsewhere classified: Secondary | ICD-10-CM | POA: Diagnosis not present

## 2022-09-26 DIAGNOSIS — G479 Sleep disorder, unspecified: Secondary | ICD-10-CM | POA: Diagnosis not present

## 2022-09-26 DIAGNOSIS — E669 Obesity, unspecified: Secondary | ICD-10-CM | POA: Diagnosis not present

## 2022-09-28 ENCOUNTER — Other Ambulatory Visit (HOSPITAL_COMMUNITY): Payer: Self-pay

## 2022-09-29 ENCOUNTER — Other Ambulatory Visit (HOSPITAL_COMMUNITY): Payer: Self-pay

## 2022-10-18 ENCOUNTER — Other Ambulatory Visit (HOSPITAL_COMMUNITY): Payer: Self-pay

## 2022-10-18 DIAGNOSIS — Z6831 Body mass index (BMI) 31.0-31.9, adult: Secondary | ICD-10-CM | POA: Diagnosis not present

## 2022-10-18 DIAGNOSIS — G4733 Obstructive sleep apnea (adult) (pediatric): Secondary | ICD-10-CM | POA: Diagnosis not present

## 2022-10-18 DIAGNOSIS — E669 Obesity, unspecified: Secondary | ICD-10-CM | POA: Diagnosis not present

## 2022-10-18 DIAGNOSIS — Z9189 Other specified personal risk factors, not elsewhere classified: Secondary | ICD-10-CM | POA: Diagnosis not present

## 2022-10-18 MED ORDER — PHENTERMINE HCL 37.5 MG PO TABS
ORAL_TABLET | ORAL | 0 refills | Status: DC
  Filled 2022-10-18: qty 30, 30d supply, fill #0

## 2022-10-19 DIAGNOSIS — E039 Hypothyroidism, unspecified: Secondary | ICD-10-CM | POA: Diagnosis not present

## 2022-10-19 DIAGNOSIS — G4733 Obstructive sleep apnea (adult) (pediatric): Secondary | ICD-10-CM | POA: Diagnosis not present

## 2022-10-19 DIAGNOSIS — E669 Obesity, unspecified: Secondary | ICD-10-CM | POA: Diagnosis not present

## 2022-10-29 ENCOUNTER — Other Ambulatory Visit (HOSPITAL_COMMUNITY): Payer: Self-pay

## 2022-11-12 ENCOUNTER — Other Ambulatory Visit (HOSPITAL_COMMUNITY): Payer: Self-pay

## 2022-11-13 ENCOUNTER — Other Ambulatory Visit (HOSPITAL_COMMUNITY): Payer: Self-pay

## 2022-11-22 DIAGNOSIS — E039 Hypothyroidism, unspecified: Secondary | ICD-10-CM | POA: Diagnosis not present

## 2022-11-22 DIAGNOSIS — E669 Obesity, unspecified: Secondary | ICD-10-CM | POA: Diagnosis not present

## 2022-11-22 DIAGNOSIS — Z6830 Body mass index (BMI) 30.0-30.9, adult: Secondary | ICD-10-CM | POA: Diagnosis not present

## 2022-11-22 DIAGNOSIS — G4733 Obstructive sleep apnea (adult) (pediatric): Secondary | ICD-10-CM | POA: Diagnosis not present

## 2022-11-24 ENCOUNTER — Other Ambulatory Visit (HOSPITAL_COMMUNITY): Payer: Self-pay

## 2022-12-18 ENCOUNTER — Other Ambulatory Visit: Payer: Self-pay

## 2022-12-18 ENCOUNTER — Other Ambulatory Visit (HOSPITAL_COMMUNITY): Payer: Self-pay

## 2022-12-18 ENCOUNTER — Other Ambulatory Visit (HOSPITAL_BASED_OUTPATIENT_CLINIC_OR_DEPARTMENT_OTHER): Payer: Self-pay

## 2022-12-18 MED ORDER — PHENTERMINE HCL 37.5 MG PO TABS
ORAL_TABLET | ORAL | 0 refills | Status: DC
  Filled 2022-12-18: qty 30, 30d supply, fill #0

## 2022-12-19 ENCOUNTER — Other Ambulatory Visit: Payer: Self-pay

## 2022-12-19 ENCOUNTER — Other Ambulatory Visit (HOSPITAL_COMMUNITY): Payer: Self-pay

## 2022-12-19 MED ORDER — FLUOXETINE HCL 20 MG PO CAPS
20.0000 mg | ORAL_CAPSULE | Freq: Every day | ORAL | 0 refills | Status: DC
  Filled 2022-12-19 – 2023-05-17 (×4): qty 90, 90d supply, fill #0

## 2022-12-19 MED ORDER — SYNTHROID 150 MCG PO TABS
75.0000 ug | ORAL_TABLET | Freq: Every day | ORAL | 0 refills | Status: DC
  Filled 2022-12-19: qty 78, 84d supply, fill #0

## 2022-12-26 DIAGNOSIS — Z9189 Other specified personal risk factors, not elsewhere classified: Secondary | ICD-10-CM | POA: Diagnosis not present

## 2022-12-26 DIAGNOSIS — E669 Obesity, unspecified: Secondary | ICD-10-CM | POA: Diagnosis not present

## 2022-12-26 DIAGNOSIS — G4733 Obstructive sleep apnea (adult) (pediatric): Secondary | ICD-10-CM | POA: Diagnosis not present

## 2022-12-26 DIAGNOSIS — Z6830 Body mass index (BMI) 30.0-30.9, adult: Secondary | ICD-10-CM | POA: Diagnosis not present

## 2022-12-27 ENCOUNTER — Other Ambulatory Visit (HOSPITAL_COMMUNITY): Payer: Self-pay

## 2022-12-27 MED ORDER — ZEPBOUND 5 MG/0.5ML ~~LOC~~ SOAJ
5.0000 mg | SUBCUTANEOUS | 0 refills | Status: AC
  Filled 2022-12-27: qty 2, 28d supply, fill #0

## 2023-01-02 ENCOUNTER — Other Ambulatory Visit (HOSPITAL_COMMUNITY): Payer: Self-pay

## 2023-01-04 ENCOUNTER — Other Ambulatory Visit: Payer: Self-pay

## 2023-01-04 ENCOUNTER — Other Ambulatory Visit (HOSPITAL_COMMUNITY): Payer: Self-pay

## 2023-01-04 ENCOUNTER — Encounter: Payer: Self-pay | Admitting: Pharmacist

## 2023-01-18 DIAGNOSIS — E039 Hypothyroidism, unspecified: Secondary | ICD-10-CM | POA: Diagnosis not present

## 2023-01-18 DIAGNOSIS — Z79899 Other long term (current) drug therapy: Secondary | ICD-10-CM | POA: Diagnosis not present

## 2023-01-18 DIAGNOSIS — Z13220 Encounter for screening for lipoid disorders: Secondary | ICD-10-CM | POA: Diagnosis not present

## 2023-01-23 DIAGNOSIS — G4733 Obstructive sleep apnea (adult) (pediatric): Secondary | ICD-10-CM | POA: Diagnosis not present

## 2023-01-23 DIAGNOSIS — Z0100 Encounter for examination of eyes and vision without abnormal findings: Secondary | ICD-10-CM | POA: Diagnosis not present

## 2023-01-23 DIAGNOSIS — Z6829 Body mass index (BMI) 29.0-29.9, adult: Secondary | ICD-10-CM | POA: Diagnosis not present

## 2023-01-23 DIAGNOSIS — Z9189 Other specified personal risk factors, not elsewhere classified: Secondary | ICD-10-CM | POA: Diagnosis not present

## 2023-01-23 DIAGNOSIS — E663 Overweight: Secondary | ICD-10-CM | POA: Diagnosis not present

## 2023-01-24 ENCOUNTER — Other Ambulatory Visit (HOSPITAL_COMMUNITY): Payer: Self-pay

## 2023-01-24 ENCOUNTER — Other Ambulatory Visit: Payer: Self-pay

## 2023-01-24 MED ORDER — ZEPBOUND 7.5 MG/0.5ML ~~LOC~~ SOAJ
7.5000 mg | SUBCUTANEOUS | 0 refills | Status: DC
  Filled 2023-01-24: qty 2, 28d supply, fill #0

## 2023-01-24 MED ORDER — PHENTERMINE HCL 37.5 MG PO TABS
18.7500 mg | ORAL_TABLET | Freq: Every day | ORAL | 0 refills | Status: DC
  Filled 2023-01-24: qty 30, 30d supply, fill #0

## 2023-01-30 ENCOUNTER — Other Ambulatory Visit (HOSPITAL_COMMUNITY): Payer: Self-pay

## 2023-01-30 DIAGNOSIS — E669 Obesity, unspecified: Secondary | ICD-10-CM | POA: Diagnosis not present

## 2023-01-30 DIAGNOSIS — M159 Polyosteoarthritis, unspecified: Secondary | ICD-10-CM | POA: Diagnosis not present

## 2023-01-30 DIAGNOSIS — F419 Anxiety disorder, unspecified: Secondary | ICD-10-CM | POA: Diagnosis not present

## 2023-01-30 DIAGNOSIS — J309 Allergic rhinitis, unspecified: Secondary | ICD-10-CM | POA: Diagnosis not present

## 2023-01-30 DIAGNOSIS — Z6830 Body mass index (BMI) 30.0-30.9, adult: Secondary | ICD-10-CM | POA: Diagnosis not present

## 2023-01-30 DIAGNOSIS — G4733 Obstructive sleep apnea (adult) (pediatric): Secondary | ICD-10-CM | POA: Diagnosis not present

## 2023-01-30 DIAGNOSIS — G479 Sleep disorder, unspecified: Secondary | ICD-10-CM | POA: Diagnosis not present

## 2023-01-30 DIAGNOSIS — E039 Hypothyroidism, unspecified: Secondary | ICD-10-CM | POA: Diagnosis not present

## 2023-01-30 DIAGNOSIS — J45991 Cough variant asthma: Secondary | ICD-10-CM | POA: Diagnosis not present

## 2023-01-30 DIAGNOSIS — F3340 Major depressive disorder, recurrent, in remission, unspecified: Secondary | ICD-10-CM | POA: Diagnosis not present

## 2023-01-30 MED ORDER — SYNTHROID 150 MCG PO TABS
ORAL_TABLET | ORAL | 1 refills | Status: DC
  Filled 2023-03-14: qty 26, 28d supply, fill #0

## 2023-01-30 MED ORDER — LEVOCETIRIZINE DIHYDROCHLORIDE 5 MG PO TABS
5.0000 mg | ORAL_TABLET | Freq: Every day | ORAL | 1 refills | Status: AC
  Filled 2023-02-27 – 2023-08-14 (×2): qty 90, 90d supply, fill #0

## 2023-01-30 MED ORDER — ETODOLAC 400 MG PO TABS
400.0000 mg | ORAL_TABLET | Freq: Two times a day (BID) | ORAL | 1 refills | Status: DC
  Filled 2023-03-10: qty 60, 30d supply, fill #0
  Filled 2023-04-05: qty 60, 30d supply, fill #1

## 2023-02-01 ENCOUNTER — Other Ambulatory Visit (HOSPITAL_COMMUNITY): Payer: Self-pay

## 2023-02-06 DIAGNOSIS — L578 Other skin changes due to chronic exposure to nonionizing radiation: Secondary | ICD-10-CM | POA: Diagnosis not present

## 2023-02-06 DIAGNOSIS — L719 Rosacea, unspecified: Secondary | ICD-10-CM | POA: Diagnosis not present

## 2023-02-06 DIAGNOSIS — D225 Melanocytic nevi of trunk: Secondary | ICD-10-CM | POA: Diagnosis not present

## 2023-02-06 DIAGNOSIS — L821 Other seborrheic keratosis: Secondary | ICD-10-CM | POA: Diagnosis not present

## 2023-02-08 ENCOUNTER — Other Ambulatory Visit (HOSPITAL_COMMUNITY): Payer: Self-pay

## 2023-02-11 ENCOUNTER — Other Ambulatory Visit (HOSPITAL_COMMUNITY): Payer: Self-pay

## 2023-02-11 MED ORDER — ZEPBOUND 7.5 MG/0.5ML ~~LOC~~ SOAJ
7.5000 mg | SUBCUTANEOUS | 0 refills | Status: DC
  Filled 2023-02-11 – 2023-03-14 (×6): qty 2, 28d supply, fill #0

## 2023-02-12 ENCOUNTER — Other Ambulatory Visit (HOSPITAL_COMMUNITY): Payer: Self-pay

## 2023-02-12 ENCOUNTER — Other Ambulatory Visit: Payer: Self-pay

## 2023-02-12 MED ORDER — ZEPBOUND 7.5 MG/0.5ML ~~LOC~~ SOAJ
7.5000 mg | SUBCUTANEOUS | 0 refills | Status: AC
  Filled 2023-02-12: qty 2, 28d supply, fill #0

## 2023-02-19 ENCOUNTER — Other Ambulatory Visit (HOSPITAL_COMMUNITY): Payer: Self-pay

## 2023-02-19 MED ORDER — CLINDAMYCIN HCL 300 MG PO CAPS
300.0000 mg | ORAL_CAPSULE | Freq: Three times a day (TID) | ORAL | 0 refills | Status: AC
  Filled 2023-02-19: qty 21, 7d supply, fill #0

## 2023-02-20 ENCOUNTER — Other Ambulatory Visit (HOSPITAL_COMMUNITY): Payer: Self-pay

## 2023-02-20 DIAGNOSIS — Z6829 Body mass index (BMI) 29.0-29.9, adult: Secondary | ICD-10-CM | POA: Diagnosis not present

## 2023-02-20 DIAGNOSIS — E663 Overweight: Secondary | ICD-10-CM | POA: Diagnosis not present

## 2023-02-20 DIAGNOSIS — G4733 Obstructive sleep apnea (adult) (pediatric): Secondary | ICD-10-CM | POA: Diagnosis not present

## 2023-02-21 ENCOUNTER — Other Ambulatory Visit (HOSPITAL_COMMUNITY): Payer: Self-pay

## 2023-02-21 MED ORDER — ZEPBOUND 10 MG/0.5ML ~~LOC~~ SOAJ
10.0000 mg | SUBCUTANEOUS | 2 refills | Status: AC
  Filled 2023-02-21: qty 2, 28d supply, fill #0
  Filled 2023-02-25 – 2023-03-16 (×4): qty 2, 28d supply, fill #1
  Filled 2023-03-27 – 2023-05-09 (×5): qty 2, 28d supply, fill #2

## 2023-02-25 ENCOUNTER — Other Ambulatory Visit (HOSPITAL_COMMUNITY): Payer: Self-pay

## 2023-02-25 ENCOUNTER — Other Ambulatory Visit: Payer: Self-pay

## 2023-02-26 ENCOUNTER — Other Ambulatory Visit (HOSPITAL_COMMUNITY): Payer: Self-pay

## 2023-02-26 ENCOUNTER — Encounter (HOSPITAL_COMMUNITY): Payer: Self-pay

## 2023-02-28 ENCOUNTER — Other Ambulatory Visit (HOSPITAL_COMMUNITY): Payer: Self-pay

## 2023-03-11 ENCOUNTER — Other Ambulatory Visit: Payer: Self-pay

## 2023-03-11 ENCOUNTER — Other Ambulatory Visit (HOSPITAL_COMMUNITY): Payer: Self-pay

## 2023-03-14 ENCOUNTER — Other Ambulatory Visit: Payer: Self-pay

## 2023-03-14 ENCOUNTER — Other Ambulatory Visit (HOSPITAL_COMMUNITY): Payer: Self-pay

## 2023-03-15 ENCOUNTER — Other Ambulatory Visit (HOSPITAL_COMMUNITY): Payer: Self-pay

## 2023-03-15 MED ORDER — LEVOTHYROXINE SODIUM 150 MCG PO TABS
ORAL_TABLET | ORAL | 1 refills | Status: DC
  Filled 2023-03-15 – 2023-03-19 (×3): qty 78, 84d supply, fill #0
  Filled 2023-06-22: qty 78, 84d supply, fill #1

## 2023-03-18 ENCOUNTER — Other Ambulatory Visit: Payer: Self-pay

## 2023-03-18 ENCOUNTER — Other Ambulatory Visit (HOSPITAL_COMMUNITY): Payer: Self-pay

## 2023-03-19 ENCOUNTER — Other Ambulatory Visit: Payer: Self-pay

## 2023-03-19 ENCOUNTER — Other Ambulatory Visit (HOSPITAL_COMMUNITY): Payer: Self-pay

## 2023-03-19 DIAGNOSIS — M5430 Sciatica, unspecified side: Secondary | ICD-10-CM | POA: Diagnosis not present

## 2023-03-19 DIAGNOSIS — E663 Overweight: Secondary | ICD-10-CM | POA: Diagnosis not present

## 2023-03-19 DIAGNOSIS — Z6828 Body mass index (BMI) 28.0-28.9, adult: Secondary | ICD-10-CM | POA: Diagnosis not present

## 2023-03-20 ENCOUNTER — Other Ambulatory Visit: Payer: Self-pay

## 2023-03-20 ENCOUNTER — Other Ambulatory Visit (HOSPITAL_COMMUNITY): Payer: Self-pay

## 2023-03-20 MED ORDER — PHENTERMINE HCL 37.5 MG PO TABS
18.7500 mg | ORAL_TABLET | Freq: Every day | ORAL | 0 refills | Status: DC
  Filled 2023-03-20: qty 30, 30d supply, fill #0

## 2023-03-20 MED ORDER — ZEPBOUND 12.5 MG/0.5ML ~~LOC~~ SOAJ
12.5000 mg | SUBCUTANEOUS | 0 refills | Status: DC
  Filled 2023-03-20 – 2023-04-06 (×4): qty 2, 28d supply, fill #0

## 2023-03-21 DIAGNOSIS — G4733 Obstructive sleep apnea (adult) (pediatric): Secondary | ICD-10-CM | POA: Diagnosis not present

## 2023-03-22 ENCOUNTER — Other Ambulatory Visit (HOSPITAL_COMMUNITY): Payer: Self-pay

## 2023-03-27 ENCOUNTER — Other Ambulatory Visit (HOSPITAL_COMMUNITY): Payer: Self-pay

## 2023-03-28 ENCOUNTER — Other Ambulatory Visit: Payer: Self-pay

## 2023-04-05 ENCOUNTER — Other Ambulatory Visit (HOSPITAL_COMMUNITY): Payer: Self-pay

## 2023-04-05 ENCOUNTER — Other Ambulatory Visit: Payer: Self-pay

## 2023-04-08 ENCOUNTER — Other Ambulatory Visit: Payer: Self-pay

## 2023-04-13 ENCOUNTER — Other Ambulatory Visit (HOSPITAL_COMMUNITY): Payer: Self-pay

## 2023-04-21 DIAGNOSIS — G4733 Obstructive sleep apnea (adult) (pediatric): Secondary | ICD-10-CM | POA: Diagnosis not present

## 2023-04-23 ENCOUNTER — Other Ambulatory Visit (HOSPITAL_COMMUNITY): Payer: Self-pay

## 2023-04-23 DIAGNOSIS — E663 Overweight: Secondary | ICD-10-CM | POA: Diagnosis not present

## 2023-04-23 DIAGNOSIS — M5430 Sciatica, unspecified side: Secondary | ICD-10-CM | POA: Diagnosis not present

## 2023-04-23 DIAGNOSIS — Z6828 Body mass index (BMI) 28.0-28.9, adult: Secondary | ICD-10-CM | POA: Diagnosis not present

## 2023-04-23 MED ORDER — ZEPBOUND 12.5 MG/0.5ML ~~LOC~~ SOAJ
12.5000 mg | SUBCUTANEOUS | 0 refills | Status: DC
  Filled 2023-04-28 – 2023-05-09 (×5): qty 2, 28d supply, fill #0

## 2023-04-29 ENCOUNTER — Other Ambulatory Visit (HOSPITAL_COMMUNITY): Payer: Self-pay

## 2023-04-29 ENCOUNTER — Other Ambulatory Visit: Payer: Self-pay

## 2023-05-01 ENCOUNTER — Other Ambulatory Visit: Payer: Self-pay

## 2023-05-01 ENCOUNTER — Other Ambulatory Visit (HOSPITAL_COMMUNITY): Payer: Self-pay

## 2023-05-07 ENCOUNTER — Other Ambulatory Visit (HOSPITAL_COMMUNITY): Payer: Self-pay

## 2023-05-07 DIAGNOSIS — E039 Hypothyroidism, unspecified: Secondary | ICD-10-CM | POA: Diagnosis not present

## 2023-05-07 DIAGNOSIS — G4733 Obstructive sleep apnea (adult) (pediatric): Secondary | ICD-10-CM | POA: Diagnosis not present

## 2023-05-09 ENCOUNTER — Other Ambulatory Visit (HOSPITAL_COMMUNITY): Payer: Self-pay

## 2023-05-09 ENCOUNTER — Other Ambulatory Visit: Payer: Self-pay

## 2023-05-17 ENCOUNTER — Other Ambulatory Visit: Payer: Self-pay

## 2023-05-17 ENCOUNTER — Other Ambulatory Visit (HOSPITAL_COMMUNITY): Payer: Self-pay

## 2023-05-17 MED ORDER — ETODOLAC 400 MG PO TABS
400.0000 mg | ORAL_TABLET | Freq: Two times a day (BID) | ORAL | 0 refills | Status: DC
  Filled 2023-05-17: qty 60, 30d supply, fill #0

## 2023-05-20 ENCOUNTER — Other Ambulatory Visit: Payer: Self-pay

## 2023-05-21 DIAGNOSIS — M5430 Sciatica, unspecified side: Secondary | ICD-10-CM | POA: Diagnosis not present

## 2023-05-21 DIAGNOSIS — Z8639 Personal history of other endocrine, nutritional and metabolic disease: Secondary | ICD-10-CM | POA: Diagnosis not present

## 2023-05-21 DIAGNOSIS — Z6827 Body mass index (BMI) 27.0-27.9, adult: Secondary | ICD-10-CM | POA: Diagnosis not present

## 2023-05-21 DIAGNOSIS — E663 Overweight: Secondary | ICD-10-CM | POA: Diagnosis not present

## 2023-05-27 DIAGNOSIS — G4733 Obstructive sleep apnea (adult) (pediatric): Secondary | ICD-10-CM | POA: Diagnosis not present

## 2023-05-31 ENCOUNTER — Other Ambulatory Visit (HOSPITAL_COMMUNITY): Payer: Self-pay

## 2023-06-18 ENCOUNTER — Other Ambulatory Visit (HOSPITAL_COMMUNITY): Payer: Self-pay

## 2023-06-18 ENCOUNTER — Other Ambulatory Visit: Payer: Self-pay

## 2023-06-18 DIAGNOSIS — Z6827 Body mass index (BMI) 27.0-27.9, adult: Secondary | ICD-10-CM | POA: Diagnosis not present

## 2023-06-18 DIAGNOSIS — E663 Overweight: Secondary | ICD-10-CM | POA: Diagnosis not present

## 2023-06-18 DIAGNOSIS — Z8639 Personal history of other endocrine, nutritional and metabolic disease: Secondary | ICD-10-CM | POA: Diagnosis not present

## 2023-06-18 DIAGNOSIS — M5430 Sciatica, unspecified side: Secondary | ICD-10-CM | POA: Diagnosis not present

## 2023-06-18 MED ORDER — PHENTERMINE HCL 37.5 MG PO TABS
18.7500 mg | ORAL_TABLET | Freq: Every morning | ORAL | 0 refills | Status: DC
  Filled 2023-06-18: qty 30, 30d supply, fill #0

## 2023-06-18 MED ORDER — ZEPBOUND 12.5 MG/0.5ML ~~LOC~~ SOAJ
12.5000 mg | SUBCUTANEOUS | 0 refills | Status: AC
  Filled 2023-06-18: qty 2, 28d supply, fill #0

## 2023-06-19 ENCOUNTER — Other Ambulatory Visit (HOSPITAL_COMMUNITY): Payer: Self-pay

## 2023-06-19 ENCOUNTER — Other Ambulatory Visit (HOSPITAL_BASED_OUTPATIENT_CLINIC_OR_DEPARTMENT_OTHER): Payer: Self-pay

## 2023-06-19 ENCOUNTER — Encounter (HOSPITAL_BASED_OUTPATIENT_CLINIC_OR_DEPARTMENT_OTHER): Payer: Self-pay | Admitting: Student

## 2023-06-19 ENCOUNTER — Ambulatory Visit (INDEPENDENT_AMBULATORY_CARE_PROVIDER_SITE_OTHER): Payer: 59 | Admitting: Student

## 2023-06-19 ENCOUNTER — Other Ambulatory Visit: Payer: Self-pay

## 2023-06-19 ENCOUNTER — Ambulatory Visit (INDEPENDENT_AMBULATORY_CARE_PROVIDER_SITE_OTHER): Payer: 59

## 2023-06-19 DIAGNOSIS — M5442 Lumbago with sciatica, left side: Secondary | ICD-10-CM | POA: Diagnosis not present

## 2023-06-19 DIAGNOSIS — M47816 Spondylosis without myelopathy or radiculopathy, lumbar region: Secondary | ICD-10-CM | POA: Diagnosis not present

## 2023-06-19 DIAGNOSIS — M5136 Other intervertebral disc degeneration, lumbar region: Secondary | ICD-10-CM | POA: Diagnosis not present

## 2023-06-19 DIAGNOSIS — M545 Low back pain, unspecified: Secondary | ICD-10-CM | POA: Diagnosis not present

## 2023-06-19 MED ORDER — METHYLPREDNISOLONE 4 MG PO TBPK
ORAL_TABLET | ORAL | 0 refills | Status: AC
  Filled 2023-06-19: qty 21, 6d supply, fill #0

## 2023-06-19 NOTE — Progress Notes (Signed)
Chief Complaint: Sciatic pain     History of Present Illness:    [name] is a 62 y.o. female presenting today for evaluation of pain and tingling down the left leg.  She does have a long history of low back issues for years.  Has had the symptoms down the left leg for the past few months.  Pain is most commonly felt in the back of the thigh but often does radiate down past the knee into the lateral and anterior lower leg.  It has been bothering her significantly at night and she has had trouble sleeping.  Pain in the low back is overall been mild.  Has been seeing a chiropractor which has not been providing significant relief.  Takes etodolac twice daily.  Denies any groin pain or notable weakness in the left leg.  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   Hypothyroidism    Seasonal allergies    Wears glasses    Past Surgical History:  Procedure Laterality Date   BREAST BIOPSY Right 01/2015   BREAST EXCISIONAL BIOPSY Right 03/29/2015   BREAST LUMPECTOMY WITH RADIOACTIVE SEED LOCALIZATION Right 03/29/2015   Procedure: RIGHT BREAST LUMPECTOMY WITH RADIOACTIVE SEED LOCALIZATION;  Surgeon: Emelia Loron, MD;  Location: [facility];  Service: General;  Laterality: Right;   COLONOSCOPY     ORIF WRIST FRACTURE  12/24/2010   right   REPAIR PERONEAL TENDONS ANKLE  12/24/2010   TOTAL THYROIDECTOMY  12/24/2006   WISDOM TOOTH EXTRACTION     Social History   Socioeconomic History   Marital status: Married    Spouse name: Not on file   Number of children: Not on file   Years of education: Not on file   Highest education level: Not on file  Occupational History   Not on file  Tobacco Use   Smoking status: Never   Smokeless  tobacco: Not on file  Substance and Sexual Activity   Alcohol use: Yes    Comment: occ   Drug use: No   Sexual activity: Not on file  Other Topics Concern   Not on file  Social History Narrative   Not on file   Social Determinants of Health   Financial Resource Strain: Not on file  Food Insecurity: Not on file  Transportation Needs: Not on file  Physical Activity: Not on file  Stress: Not on file  Social Connections: Not on file   History reviewed. No pertinent family history. Allergies  Allergen Reactions   Contrast Media [Iodinated Contrast Media] Swelling    BLUE   Amoxicillin Rash   Current Outpatient Medications  Medication Sig Dispense Refill   albuterol (PROVENTIL HFA;VENTOLIN HFA) 108 (90 BASE) MCG/ACT inhaler Inhale into the lungs every 6 (six) hours as needed for wheezing or shortness of breath.     budesonide-formoterol (SYMBICORT) 80-4.5 MCG/ACT inhaler INHALE 2 PUFFS BY MOUTH INTO THE LUNGS ONCE A DAY 20.4 g 1   budesonide-formoterol (SYMBICORT) 80-4.5 MCG/ACT inhaler Inhale 2 puffs into the lungs once a day 20.4 g 1   budesonide-formoterol (SYMBICORT) 80-4.5 MCG/ACT  inhaler Inhale 2 puffs into the lungs once a day. 30.6 g 3   buPROPion (WELLBUTRIN XL) 150 MG 24 hr tablet TAKE 1 TABLET BY MOUTH EVERY MORNING 90 tablet 1   buPROPion (WELLBUTRIN XL) 150 MG 24 hr tablet Take 1 tablet by mouth every morning. 90 tablet 1   buPROPion (WELLBUTRIN XL) 150 MG 24 hr tablet Take 1 tablet by mouth every morning. 90 tablet 3   calcium carbonate 200 MG capsule Take 250 mg by mouth 2 (two) times daily with a meal.     cholecalciferol (VITAMIN D) 1000 UNITS tablet Take 1,000 Units by mouth daily.     clindamycin (CLEOCIN) 300 MG capsule Take 1 capsule (300 mg total) by mouth 3 (three) times daily. 21 capsule 0   COVID-19 At Home Antigen Test [facility] COVID-19 HOME TEST) KIT use as directed. 4 each 0   COVID-19 mRNA Vac-TriS, Pfizer, (PFIZER-BIONT COVID-19 VAC-TRIS) SUSP  injection Inject into the muscle. 0.3 mL 0   cyclobenzaprine (FLEXERIL) 10 MG tablet Take 1 tablet by mouth daily as needed as directed 30 tablet 3   cyclobenzaprine (FLEXERIL) 10 MG tablet Take 1 tablet (10 mg total) by mouth as needed as directed. 30 tablet 3   diclofenac sodium (VOLTAREN) 1 % GEL Apply 4 g topically 4 (four) times daily as needed. 500 g 6   doxycycline (PERIOSTAT) 20 MG tablet Take 1 tablet (20 mg total) by mouth 2 times a day 1 hour before breakfast and 1 hour before evening meal 180 tablet 4   DULERA 200-5 MCG/ACT AERO   11   estradiol (VIVELLE-DOT) 0.05 MG/24HR patch APPLY 1 PATCH TO SKIN TWICE WEEKLY 24 patch 3   estradiol (VIVELLE-DOT) 0.05 MG/24HR patch Place 1 patch (0.05 mg total) onto the skin 2 (two) times a week. 24 patch 3   estradiol (VIVELLE-DOT) 0.05 MG/24HR patch Place 1 patch (0.05 mg total) onto the skin 2 (two) times a week. 24 patch 3   etodolac (LODINE) 400 MG tablet Take 1 tablet (400 mg total) by mouth 2 (two) times daily with food 60 tablet 0   FLUoxetine (PROZAC) 20 MG capsule TAKE 1 CAPSULE BY MOUTH ONCE A DAY IN THE MORNING 90 capsule 1   FLUoxetine (PROZAC) 20 MG capsule Take 1 capsule (20 mg total) by mouth daily. 90 capsule 0   ibuprofen (ADVIL) 200 MG tablet Take 200 mg by mouth every 6 (six) hours as needed.     levocetirizine (XYZAL) 5 MG tablet TAKE 1 TABLET BY MOUTH ONCE DAILY 90 tablet 1   levocetirizine (XYZAL) 5 MG tablet Take 1 tablet by mouth once a day 90 tablet 2   levocetirizine (XYZAL) 5 MG tablet Take 1 tablet (5 mg total) by mouth daily. 90 tablet 1   levothyroxine (SYNTHROID) 150 MCG tablet TAKE 1 TABLET BY MOUTH ON AN EMPTY STOMACH ONCE A DAY IN THE MORNING 90 tablet 1   levothyroxine (SYNTHROID) 150 MCG tablet TAKE 1 TABLET BY MOUTH ON AN EMPTY STOMACH IN THE MORNING FOR 6 DAYS A WEEK 80 tablet 1   levothyroxine (SYNTHROID) 150 MCG tablet Take 1 tablet (150 mcg) by mouth on an empty stomach 6 days per week and one-half tablet  ( ) 1 day per week. 78 tablet 1   Liraglutide -Weight Management (SAXENDA) 18 MG/3ML SOPN INJECT 3 MG UNDER THE SKIN ONCE A DAY 15 mL 0   Liraglutide -Weight Management (SAXENDA) 18 MG/3ML SOPN INJECT 3 MG UNDER THE SKIN ONCE A  DAY . 15 mL 6   LORazepam (ATIVAN) 0.5 MG tablet   0   phentermine (ADIPEX-P) 37.5 MG tablet Take 0.5-1 tablets (18.75-37.5 mg total) by mouth in the morning before breakfast. 30 tablet 0   progesterone (PROMETRIUM) 100 MG capsule TAKE 1 CAPSULE BY MOUTH ONCE DAILY 90 capsule 3   progesterone (PROMETRIUM) 100 MG capsule TAKE 1 CAPSULE BY MOUTH ONCE DAILY 90 capsule 3   Semaglutide, 1 MG/DOSE, (OZEMPIC, 1 MG/DOSE,) 4 MG/3ML SOPN Inject 1 mg into skin once a week 6 mL 1   Semaglutide, 1 MG/DOSE, (OZEMPIC, 1 MG/DOSE,) 4 MG/3ML SOPN Inject 1 mg into the skin once a week 3 mL 5   Semaglutide,0.25 or 0.5MG /DOS, (OZEMPIC, 0.25 OR 0.5 MG/DOSE,) 2 MG/1.5ML SOPN Inject 0.5 mg (1 pen) Subcutaneous once a week - FD 10/18/21 30 days 3 mL 0   Semaglutide,0.25 or 0.5MG /DOS, (OZEMPIC, 0.25 OR 0.5 MG/DOSE,) 2 MG/1.5ML SOPN Inject 0.25 mg once weekly for 4 weeks then 0.5 mg once weekly for 2 weeks Subcutaneous once a week 30 days 3 mL 0   tirzepatide (ZEPBOUND) 10 MG/0.5ML Pen Inject 10 mg into the skin once a week. 2 mL 2   tirzepatide (ZEPBOUND) 12.5 MG/0.5ML Pen Inject 12.5 mg into the skin once a week. 2 mL 0   tirzepatide (ZEPBOUND) 5 MG/0.5ML Pen Inject 5 mg into the skin once a week. 2 mL 0   tirzepatide (ZEPBOUND) 7.5 MG/0.5ML Pen Inject 7.5 mg into the skin every 7 (seven) days. 2 mL 0   traMADol (ULTRAM) 50 MG tablet Take 1 - 2 tablets by mouth every 6 hours as needed for pain 30 tablet 0   zaleplon (SONATA) 10 MG capsule Take by mouth.     No current facility-administered medications for this visit.   No results found.  Review of Systems:   A ROS was performed including pertinent positives and negatives as documented in the HPI.  Physical Exam :   Constitutional: NAD  and appears stated age Neurological: Alert and oriented Psych: Appropriate affect and cooperative There were no vitals taken for this visit.   Comprehensive Musculoskeletal Exam:    No tenderness over the lumbar spine and paralumbar area.  Positive left straight leg raise.  Normal passive hip range of motion bilaterally in the supine position with flexion, internal rotation, and external rotation.  Knee flexion strength 5/5.  Patellar reflex 2+ bilaterally.  Imaging:   Xray (lumbar spine 4 views): Some loss of lordotic curvature.  Narrowing of intervertebral disc spaces with some anterior spurring and facet arthropathy most notable between L3 and S1.    I personally reviewed and interpreted the radiographs.   Assessment:   62 y.o. female presenting with mild low back pain and left-sided sciatica.  She has been having low back pain for years and does have degenerative changes on x-ray.  After discussing treatment options, I did recommend to proceed with short course of oral steroids for symptom relief and referral to physical therapy.  Will plan to have her return in 5 to 6 weeks for reevaluation and assess progress with PT.  At that time can consider proceeding with MRI if symptoms have not significantly improved.  Plan :    -Start Medrol Dosepak and referral to physical therapy -Return to clinic in 5 to 6 weeks for reevaluation     I personally saw and evaluated the patient, and participated in the management and treatment plan.  Hazle Nordmann, PA-C Orthopedics  This document  was dictated using Systems analyst. A reasonable attempt at proof reading has been made to minimize errors.

## 2023-06-20 ENCOUNTER — Other Ambulatory Visit: Payer: Self-pay

## 2023-06-20 ENCOUNTER — Other Ambulatory Visit (HOSPITAL_COMMUNITY): Payer: Self-pay

## 2023-06-20 MED ORDER — ETODOLAC 400 MG PO TABS
400.0000 mg | ORAL_TABLET | Freq: Two times a day (BID) | ORAL | 0 refills | Status: DC
  Filled 2023-06-20: qty 60, 30d supply, fill #0

## 2023-06-24 ENCOUNTER — Other Ambulatory Visit: Payer: Self-pay

## 2023-06-26 DIAGNOSIS — G4733 Obstructive sleep apnea (adult) (pediatric): Secondary | ICD-10-CM | POA: Diagnosis not present

## 2023-07-02 ENCOUNTER — Other Ambulatory Visit (HOSPITAL_COMMUNITY): Payer: Self-pay

## 2023-07-02 ENCOUNTER — Other Ambulatory Visit: Payer: Self-pay

## 2023-07-02 MED ORDER — FLUOXETINE HCL 20 MG PO CAPS: 20.0000 mg | ORAL_CAPSULE | Freq: Every day | ORAL | 0 refills | Status: AC

## 2023-07-02 MED ORDER — BUPROPION HCL ER (XL) 150 MG PO TB24
150.0000 mg | ORAL_TABLET | Freq: Every morning | ORAL | 0 refills | Status: DC
  Filled 2023-07-02: qty 90, 90d supply, fill #0

## 2023-07-03 ENCOUNTER — Other Ambulatory Visit: Payer: Self-pay | Admitting: Oncology

## 2023-07-03 DIAGNOSIS — Z006 Encounter for examination for normal comparison and control in clinical research program: Secondary | ICD-10-CM

## 2023-07-13 ENCOUNTER — Other Ambulatory Visit (HOSPITAL_COMMUNITY): Payer: Self-pay

## 2023-07-15 ENCOUNTER — Other Ambulatory Visit (HOSPITAL_COMMUNITY): Payer: Self-pay

## 2023-07-17 ENCOUNTER — Other Ambulatory Visit (HOSPITAL_COMMUNITY): Payer: Self-pay

## 2023-07-17 DIAGNOSIS — M5430 Sciatica, unspecified side: Secondary | ICD-10-CM | POA: Diagnosis not present

## 2023-07-17 DIAGNOSIS — E663 Overweight: Secondary | ICD-10-CM | POA: Diagnosis not present

## 2023-07-17 DIAGNOSIS — Z6826 Body mass index (BMI) 26.0-26.9, adult: Secondary | ICD-10-CM | POA: Diagnosis not present

## 2023-07-17 DIAGNOSIS — Z8639 Personal history of other endocrine, nutritional and metabolic disease: Secondary | ICD-10-CM | POA: Diagnosis not present

## 2023-07-18 ENCOUNTER — Other Ambulatory Visit: Payer: Self-pay

## 2023-07-18 ENCOUNTER — Other Ambulatory Visit (HOSPITAL_COMMUNITY): Payer: Self-pay

## 2023-07-18 ENCOUNTER — Ambulatory Visit (HOSPITAL_BASED_OUTPATIENT_CLINIC_OR_DEPARTMENT_OTHER): Payer: 59 | Attending: Student | Admitting: Physical Therapy

## 2023-07-18 ENCOUNTER — Encounter (HOSPITAL_BASED_OUTPATIENT_CLINIC_OR_DEPARTMENT_OTHER): Payer: Self-pay | Admitting: Physical Therapy

## 2023-07-18 DIAGNOSIS — M5416 Radiculopathy, lumbar region: Secondary | ICD-10-CM | POA: Diagnosis not present

## 2023-07-18 DIAGNOSIS — M5459 Other low back pain: Secondary | ICD-10-CM | POA: Insufficient documentation

## 2023-07-18 DIAGNOSIS — M5442 Lumbago with sciatica, left side: Secondary | ICD-10-CM | POA: Insufficient documentation

## 2023-07-18 MED ORDER — ZEPBOUND 12.5 MG/0.5ML ~~LOC~~ SOAJ
12.5000 mg | SUBCUTANEOUS | 0 refills | Status: AC
  Filled 2023-07-18: qty 2, 28d supply, fill #0

## 2023-07-18 NOTE — Therapy (Signed)
OUTPATIENT PHYSICAL THERAPY THORACOLUMBAR EVALUATION   Patient Name: [name] MRN: [id number] DOB:[birth year], 62 y.o., female Today's Date: 07/19/2023  END OF SESSION:  PT End of Session - 07/18/23 2045     Visit Number 1    Number of Visits 12    Date for PT Re-Evaluation 08/29/23    PT Start Time 0835    PT Stop Time 0925    PT Time Calculation (min) 50 min    Activity Tolerance Patient tolerated treatment well    Behavior During Therapy [facility] for tasks assessed/performed             Past Medical History:  Diagnosis Date   Exercise-induced asthma    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   Hypothyroidism    Seasonal allergies    Wears glasses    Past Surgical History:  Procedure Laterality Date   BREAST BIOPSY Right 01/2015   BREAST EXCISIONAL BIOPSY Right 03/29/2015   BREAST LUMPECTOMY WITH RADIOACTIVE SEED LOCALIZATION Right 03/29/2015   Procedure: RIGHT BREAST LUMPECTOMY WITH RADIOACTIVE SEED LOCALIZATION;  Surgeon: Emelia Loron, MD;  Location: [facility];  Service: General;  Laterality: Right;   COLONOSCOPY     ORIF WRIST FRACTURE  12/24/2010   right   REPAIR PERONEAL TENDONS ANKLE  12/24/2010   TOTAL THYROIDECTOMY  12/24/2006   WISDOM TOOTH EXTRACTION     Patient Active Problem List   Diagnosis Date Noted   Allergic rhinitis 05/05/2021   Anxiety disorder 05/05/2021   Chronic pain 05/05/2021   Asthma 05/05/2021   Depressive disorder 05/05/2021   Dupuytren contracture 05/05/2021   Eczema 05/05/2021   Generalized osteoarthritis 05/05/2021   Hypervitaminosis D 05/05/2021   Hypothyroidism 05/05/2021   Insomnia 05/05/2021   Menopausal symptom 05/05/2021   Primary osteoarthritis, unspecified hand 05/05/2021   Recurrent major depression in remission (HCC) 05/05/2021   Sleep disorder 05/05/2021   Dupuytren's contracture of right hand 08/12/2017   Knee pain 08/25/2013   Personal history of colonic polyps 08/24/2013   Metatarsalgia  of left foot 01/09/2012   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/22/2010    PCP: Selena Batten MD   REFERRING PROVIDER: Hazle Nordmann PA   REFERRING DIAG:   Rationale for Evaluation and Treatment: Rehabilitation  THERAPY DIAG:  Other low back pain  Radiculopathy, lumbar region  ONSET DATE: Intermittent but recent exacerbation about a month ago   SUBJECTIVE:                                                                                                                                                                                           SUBJECTIVE  STATEMENT: The patient had an insidious onset of left-sided low back pain and left-sided radiculopathy and numbness that began about a month ago.  She has a long history of intermittent low back pain.  She had a course of prednisone which decreased her pain.  The numbness has continued.  She has increased pain when she sits too long.  Her pain over the past week has not been bad but the numbness remains..  She also has a history of cervical radiculopathy.  She has intermittent numbness in her arm.  Her current prescription is just for her back. PERTINENT HISTORY:  Hypothyroidism, right lumpectomy, right repair of peroneal tendons.  PAIN:  Are you having pain? Yes: NPRS scale: 1-2/10 at worst mostly numbness  Pain location: left side of lower back and into the leg  Pain description: aching/ numbness  Aggravating factors: sitting Relieving factors: moving   PRECAUTIONS: None  RED FLAGS: None   WEIGHT BEARING RESTRICTIONS: No  FALLS:  Has patient fallen in last 6 months? 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   FINDINGS: Slight dextrocurvature of the lumbar spine. Preserved vertebral body heights. No acute compression fracture, wedge-shaped deformity or focal kyphosis. Scattered multilevel degenerative changes with disc space narrowing most pronounced at L4-5 and L5-S1. No instability with limited flexion and extension. SI joints are maintained. Included pelvis intact. Nonobstructive bowel gas pattern.      PATIENT SURVEYS:  FOTO    SCREENING FOR RED FLAGS: Bowel or bladder incontinence: No Spinal tumors: No Cauda equina syndrome: No Compression fracture: No Abdominal aneurysm: No  COGNITION: Overall cognitive status: Within functional limits for tasks assessed     SENSATION: Numbness down posterior aspect of upper left leg  POSTURE: No Significant postural limitations  PALPATION: Significant tenderness to palpation in left and right gluteal and left and right lumbar paraspinal  LUMBAR ROM:   AROM eval  Flexion Full flexion with minor pain at end range  Extension Painful  Right lateral flexion   Left lateral flexion No pain  Right rotation   Left rotation Creates numbness   (Blank rows = not tested)  LOWER EXTREMITY ROM:     Passive Right eval Left eval  Hip flexion  Full  Hip extension    Hip abduction    Hip adduction    Hip internal rotation  Full  Hip external rotation  Full  Knee flexion    Knee extension    Ankle dorsiflexion    Ankle plantarflexion    Ankle inversion    Ankle eversion     (Blank rows = not tested)  LOWER EXTREMITY MMT:    MMT Right eval Left eval  Hip flexion 20.9 20.1   Hip extension    Hip abduction 35.2 34.1  Hip adduction    Hip internal rotation    Hip external rotation    Knee flexion    Knee extension 30.9 36.8  Ankle dorsiflexion    Ankle plantarflexion    Ankle inversion    Ankle eversion     (Blank rows =  not tested)   GAIT: No significant gait  deviations TODAY'S TREATMENT:                                                                                                                              DATE:   Access Code: [email or web address] URL: [email or web address] Date: 07/18/2023 Prepared by: Lorayne Bender  Exercises - Standing Glute Med Mobilization with Small Ball on Wall  - 1 x daily - 7 x weekly - 3 sets - 10 reps - Supine Piriformis Stretch with Foot on Ground  - 1 x daily - 7 x weekly - 3 sets - 10 reps - Supine Bridge  - 1 x daily - 7 x weekly - 3 sets - 10 reps - Hooklying Clamshell with Resistance  - 1 x daily - 7 x weekly - 3 sets - 10 reps  PATIENT EDUCATION:  Education details: Symptom management, self soft tissue mobilization, HEP, POC Person educated: Patient Education method: Explanation, Demonstration, Tactile cues, Verbal cues, and Handouts Education comprehension: verbalized understanding, returned demonstration, verbal cues required, tactile cues required, and needs further education  HOME EXERCISE PROGRAM: Access Code: [email or web address] URL: [email or web address] Date: 07/18/2023 Prepared by: Lorayne Bender  Exercises - Standing Glute Med Mobilization with Small Ball on Wall  - 1 x daily - 7 x weekly - 3 sets - 10 reps - Supine Piriformis Stretch with Foot on Ground  - 1 x daily - 7 x weekly - 3 sets - 10 reps - Supine Bridge  - 1 x daily - 7 x weekly - 3 sets - 10 reps - Hooklying Clamshell with Resistance  - 1 x daily - 7 x weekly - 3 sets - 10 reps  ASSESSMENT:  CLINICAL IMPRESSION: Patient is a 26 old female who presents with a long history of left-sided low back pain, numbness, and radiculopathy into her left leg.  She had recent exacerbation of pain a month ago.  She had a course of prednisone which helped the pain but she continues to have numbness into the leg.  We are able to reproduce the numbness with left rotation.  She also is uncomfortable in extension.  Her x-rays show  degeneration at L4-L5 and L5-S1.  She has mild general weakness gluteals and knee extensors but no significant side-to-side difference.  She has spasming in the bilateral gluteals and bilateral lower lumbar paraspinals.  She would benefit from skilled therapy to reduce radicular symptoms and improve her ability to be able to sit at work without increased pain and numbness.  She would also benefit from a program to reduce incidence of further exacerbation in the future. OBJECTIVE IMPAIRMENTS: decreased activity tolerance, decreased ROM, decreased strength, increased muscle spasms, and pain.   ACTIVITY LIMITATIONS: sitting and standing  PARTICIPATION LIMITATIONS: driving and occupation  PERSONAL FACTORS: 1-2 comorbidities: Lumbar radiculopathy  are also affecting patient's functional outcome.   REHAB POTENTIAL: Good  CLINICAL DECISION MAKING: Evolving/moderate complexity numbness affecting her daily  activity  EVALUATION COMPLEXITY: Moderate   GOALS: Goals reviewed with patient? Yes  SHORT TERM GOALS: Target date: 08/15/2023 ]  Patient will report a 50% reduction in numbness into her left patient will leg Baseline: Goal status: INITIAL  2.  Patient will increase gross right lower extremity strength by 5 pounds Baseline:  Goal status: INITIAL  3.  Patient will will be independent with basic HEP Baseline:  Goal status: INITIAL   LONG TERM GOALS: Target date: 09/12/2023    Patient will sit at work without increased pain down her leg. Baseline:  Goal status: INITIAL  2.  Patient will have a full gym and home program that does not cause increased radicular symptoms into her leg Baseline:  Goal status: INITIAL  3.  Patient will perform all ADLs and IADLs without increased pain or numbness in her back or leg Baseline:  Goal status: INITIAL   PLAN:  PT FREQUENCY: 1-2x/week  PT DURATION: 6 weeks  PLANNED INTERVENTIONS: Therapeutic exercises, Therapeutic activity,  Neuromuscular re-education, Balance training, Patient/Family education, Self Care, Joint mobilization, Aquatic Therapy, Dry Needling, Spinal mobilization, Cryotherapy, Moist heat, Taping, Ultrasound, Manual therapy, and Re-evaluation.  PLAN FOR NEXT SESSION: Consider manual therapy and trigger point dry needling to the lower lumbar spine, consider LAD with monitoring of radicular symptoms.Add 1-2 more supine core stability exercises. Give patient several series of core stability exercises form different positions. Consider progression to the gym. Looking at hips patient is likely more hyper mobile. Focus more on stability.    Dessie Coma, PT 07/19/2023, 12:49 PM

## 2023-07-19 ENCOUNTER — Encounter (HOSPITAL_BASED_OUTPATIENT_CLINIC_OR_DEPARTMENT_OTHER): Payer: Self-pay | Admitting: Physical Therapy

## 2023-07-19 ENCOUNTER — Other Ambulatory Visit: Payer: Self-pay

## 2023-07-20 ENCOUNTER — Encounter (HOSPITAL_COMMUNITY): Payer: Self-pay

## 2023-07-20 ENCOUNTER — Other Ambulatory Visit (HOSPITAL_COMMUNITY): Payer: Self-pay

## 2023-07-23 ENCOUNTER — Other Ambulatory Visit (HOSPITAL_COMMUNITY): Payer: Self-pay

## 2023-07-23 ENCOUNTER — Other Ambulatory Visit: Payer: Self-pay

## 2023-07-23 MED ORDER — ETODOLAC 400 MG PO TABS
400.0000 mg | ORAL_TABLET | Freq: Two times a day (BID) | ORAL | 0 refills | Status: AC
  Filled 2023-07-23: qty 60, 30d supply, fill #0

## 2023-07-26 ENCOUNTER — Encounter (HOSPITAL_BASED_OUTPATIENT_CLINIC_OR_DEPARTMENT_OTHER): Payer: Self-pay

## 2023-07-26 ENCOUNTER — Ambulatory Visit (HOSPITAL_BASED_OUTPATIENT_CLINIC_OR_DEPARTMENT_OTHER): Payer: 59 | Attending: Student

## 2023-07-26 DIAGNOSIS — M5459 Other low back pain: Secondary | ICD-10-CM | POA: Diagnosis not present

## 2023-07-26 DIAGNOSIS — M5416 Radiculopathy, lumbar region: Secondary | ICD-10-CM | POA: Diagnosis not present

## 2023-07-26 NOTE — Therapy (Signed)
OUTPATIENT PHYSICAL THERAPY THORACOLUMBAR EVALUATION   Patient Name: [name] MRN: [id number] DOB:10-Apr-1961, 62 y.o., female Today's Date: 07/26/2023  END OF SESSION:  PT End of Session - 07/26/23 0726     Visit Number 2    Number of Visits 12    Date for PT Re-Evaluation 08/29/23    PT Start Time 0715    PT Stop Time 0800    PT Time Calculation (min) 45 min    Activity Tolerance Patient tolerated treatment well    Behavior During Therapy [facility] for tasks assessed/performed              Past Medical History:  Diagnosis Date   Exercise-induced asthma    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   Hypothyroidism    Seasonal allergies    Wears glasses    Past Surgical History:  Procedure Laterality Date   BREAST BIOPSY Right 01/2015   BREAST EXCISIONAL BIOPSY Right 03/29/2015   BREAST LUMPECTOMY WITH RADIOACTIVE SEED LOCALIZATION Right 03/29/2015   Procedure: RIGHT BREAST LUMPECTOMY WITH RADIOACTIVE SEED LOCALIZATION;  Surgeon: Emelia Loron, MD;  Location: [facility];  Service: General;  Laterality: Right;   COLONOSCOPY     ORIF WRIST FRACTURE  12/24/2010   right   REPAIR PERONEAL TENDONS ANKLE  12/24/2010   TOTAL THYROIDECTOMY  12/24/2006   WISDOM TOOTH EXTRACTION     Patient Active Problem List   Diagnosis Date Noted   Allergic rhinitis 05/05/2021   Anxiety disorder 05/05/2021   Chronic pain 05/05/2021   Asthma 05/05/2021   Depressive disorder 05/05/2021   Dupuytren contracture 05/05/2021   Eczema 05/05/2021   Generalized osteoarthritis 05/05/2021   Hypervitaminosis D 05/05/2021   Hypothyroidism 05/05/2021   Insomnia 05/05/2021   Menopausal symptom 05/05/2021   Primary osteoarthritis, unspecified hand 05/05/2021   Recurrent major depression in remission (HCC) 05/05/2021   Sleep disorder 05/05/2021   Dupuytren's contracture of right hand 08/12/2017   Knee pain 08/25/2013   Personal history of colonic polyps 08/24/2013   Metatarsalgia  of left foot 01/09/2012   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 Worse with recent gardening and painting. Constant numbness in L LE and intermittent numbness in R LE.   The patient had an insidious onset of left-sided low back pain and left-sided radiculopathy and numbness that began about a month ago.  She has a long history of intermittent low back pain.  She had a course of prednisone which decreased her pain.  The numbness has continued.  She has increased pain when she sits too long.  Her pain over the past week has not been bad but the numbness remains..  She also has a history of cervical radiculopathy.  She has intermittent numbness in her arm.  Her current prescription is just for her back. PERTINENT HISTORY:  Hypothyroidism, right lumpectomy, right repair of peroneal tendons.  PAIN:  Are you having pain? Yes: NPRS scale: No/10 at worst mostly numbness  Pain location: left side of lower back and into the leg  Pain description: aching/ numbness  Aggravating factors: sitting Relieving factors: moving   PRECAUTIONS: None  RED FLAGS: None   WEIGHT BEARING RESTRICTIONS: No  FALLS:  Has patient fallen in last 6 months? 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   FINDINGS: Slight dextrocurvature of the lumbar spine. Preserved vertebral body heights. No acute compression fracture, wedge-shaped deformity or focal kyphosis. Scattered multilevel degenerative changes with disc space narrowing most pronounced at L4-5 and L5-S1. No instability with limited flexion and extension. SI joints are maintained. Included pelvis intact. Nonobstructive bowel gas pattern.      PATIENT SURVEYS:  FOTO    SCREENING FOR RED FLAGS: Bowel or bladder incontinence: No Spinal tumors: No Cauda equina syndrome: No Compression fracture: No Abdominal aneurysm: No  COGNITION: Overall cognitive status: Within functional limits for tasks assessed     SENSATION: Numbness down posterior aspect of upper left leg  POSTURE: No Significant postural limitations  PALPATION: Significant tenderness to palpation in left and right gluteal and left and right lumbar paraspinal  LUMBAR ROM:   AROM eval  Flexion Full flexion with minor pain at end range  Extension Painful  Right lateral flexion   Left lateral flexion No pain  Right rotation   Left rotation Creates numbness   (Blank rows = not tested)  LOWER EXTREMITY ROM:     Passive Right eval Left eval  Hip flexion  Full  Hip extension    Hip abduction    Hip adduction    Hip internal rotation  Full  Hip external rotation  Full  Knee flexion    Knee extension    Ankle dorsiflexion    Ankle plantarflexion    Ankle inversion    Ankle eversion     (Blank rows = not tested)  LOWER EXTREMITY MMT:    MMT Right eval Left eval  Hip flexion 20.9 20.1   Hip extension    Hip abduction 35.2 34.1  Hip adduction    Hip internal rotation    Hip external rotation    Knee flexion  Knee extension 30.9 36.8   Ankle dorsiflexion    Ankle plantarflexion    Ankle inversion    Ankle eversion     (Blank rows = not tested)   GAIT: No significant gait deviations TODAY'S TREATMENT:                                                                                                                              DATE:   8/2 Manual HS stretch Piriformis stretch DKTC 20sec x3 LTR x5ea- 3sec hold PPT 5" x10  STM to bil glutes and lumbar ps in sidelying (pillow b/w knees)  PATIENT EDUCATION:  Education details: Symptom management, self soft tissue mobilization, HEP, POC Person educated: Patient Education method: Explanation, Demonstration, Tactile cues, Verbal cues, and Handouts Education comprehension: verbalized understanding, returned demonstration, verbal cues required, tactile cues required, and needs further education  HOME EXERCISE PROGRAM: Access Code: [email or web address] URL: [email or web address] Date: 07/18/2023 Prepared by: Lorayne Bender  Exercises - Standing Glute Med Mobilization with Small Ball on Wall  - 1 x daily - 7 x weekly - 3 sets - 10 reps - Supine Piriformis Stretch with Foot on Ground  - 1 x daily - 7 x weekly - 3 sets - 10 reps - Supine Bridge  - 1 x daily - 7 x weekly - 3 sets - 10 reps - Hooklying Clamshell with Resistance  - 1 x daily - 7 x weekly - 3 sets - 10 reps  ASSESSMENT:  CLINICAL IMPRESSION: No worsening of radicular symptoms with manual stretching or gentle lumbopelvic stabilization interventions. Pt very tender to STM to bilateral glutes. Mild tenderness in bilateral lumbar paraspinals. Spent tim e to [facility] to these areas to decrease tension and improve pain level. Pt without difficulty with transfers/bed mobility. Will assess response to tx next visit and progress as tolerated.   Eval: Patient is a 50 old female who presents with a long history of left-sided low back pain, numbness, and radiculopathy into her left leg.  She had recent exacerbation of pain  a month ago.  She had a course of prednisone which helped the pain but she continues to have numbness into the leg.  We are able to reproduce the numbness with left rotation.  She also is uncomfortable in extension.  Her x-rays show degeneration at L4-L5 and L5-S1.  She has mild general weakness gluteals and knee extensors but no significant side-to-side difference.  She has spasming in the bilateral gluteals and bilateral lower lumbar paraspinals.  She would benefit from skilled therapy to reduce radicular symptoms and improve her ability to be able to sit at work without increased pain and numbness.  She would also benefit from a program to reduce incidence of further exacerbation in the future. OBJECTIVE IMPAIRMENTS: decreased activity tolerance, decreased ROM, decreased strength, increased muscle spasms, and pain.   ACTIVITY LIMITATIONS: sitting and standing  PARTICIPATION LIMITATIONS: driving and occupation  PERSONAL FACTORS: 1-2 comorbidities: Lumbar radiculopathy  are also affecting patient's functional outcome.   REHAB POTENTIAL: Good  CLINICAL DECISION MAKING: Evolving/moderate complexity numbness affecting her daily activity  EVALUATION COMPLEXITY: Moderate   GOALS: Goals reviewed with patient? Yes  SHORT TERM GOALS: Target date: 08/15/2023 ]  Patient will report a 50% reduction in numbness into her left patient will leg Baseline: Goal status: INITIAL  2.  Patient will increase gross right lower extremity strength by 5 pounds Baseline:  Goal status: INITIAL  3.  Patient will will be independent with basic HEP Baseline:  Goal status: INITIAL   LONG TERM GOALS: Target date: 09/12/2023    Patient will sit at work without increased pain down her leg. Baseline:  Goal status: INITIAL  2.  Patient will have a full gym and home program that does not cause increased radicular symptoms into her leg Baseline:  Goal status: INITIAL  3.  Patient will perform all ADLs and  IADLs without increased pain or numbness in her back or leg Baseline:  Goal status: INITIAL   PLAN:  PT FREQUENCY: 1-2x/week  PT DURATION: 6 weeks  PLANNED INTERVENTIONS: Therapeutic exercises, Therapeutic activity, Neuromuscular re-education, Balance training, Patient/Family education, Self Care, Joint mobilization, Aquatic Therapy, Dry Needling, Spinal mobilization, Cryotherapy, Moist heat, Taping, Ultrasound, Manual therapy, and Re-evaluation.  PLAN FOR NEXT SESSION: Consider manual therapy and trigger point dry needling to the lower lumbar spine, consider LAD with monitoring of radicular symptoms.Add 1-2 more supine core stability exercises. Give patient several series of core stability exercises form different positions. Consider progression to the gym. Looking at hips patient is likely more hyper mobile. Focus more on stability.    Donnel Saxon , PTA 07/26/2023, 9:09 AM

## 2023-07-27 DIAGNOSIS — G4733 Obstructive sleep apnea (adult) (pediatric): Secondary | ICD-10-CM | POA: Diagnosis not present

## 2023-08-02 ENCOUNTER — Ambulatory Visit (HOSPITAL_BASED_OUTPATIENT_CLINIC_OR_DEPARTMENT_OTHER): Payer: 59

## 2023-08-02 ENCOUNTER — Encounter (HOSPITAL_BASED_OUTPATIENT_CLINIC_OR_DEPARTMENT_OTHER): Payer: Self-pay

## 2023-08-02 DIAGNOSIS — M5416 Radiculopathy, lumbar region: Secondary | ICD-10-CM

## 2023-08-02 DIAGNOSIS — Z79899 Other long term (current) drug therapy: Secondary | ICD-10-CM | POA: Diagnosis not present

## 2023-08-02 DIAGNOSIS — E039 Hypothyroidism, unspecified: Secondary | ICD-10-CM | POA: Diagnosis not present

## 2023-08-02 DIAGNOSIS — M5459 Other low back pain: Secondary | ICD-10-CM

## 2023-08-02 NOTE — Therapy (Signed)
OUTPATIENT PHYSICAL THERAPY THORACOLUMBAR TREATMENT   Patient Name: [name] MRN: [id number] DOB:[birth year], 62 y.o., female Today's Date: 08/02/2023  END OF SESSION:  PT End of Session - 08/02/23 1504     Visit Number 3    Number of Visits 12    Date for PT Re-Evaluation 08/29/23    PT Start Time 1348    PT Stop Time 1430    PT Time Calculation (min) 42 min    Activity Tolerance Patient tolerated treatment well    Behavior During Therapy [facility] for tasks assessed/performed               Past Medical History:  Diagnosis Date   Exercise-induced asthma    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   Hypothyroidism    Seasonal allergies    Wears glasses    Past Surgical History:  Procedure Laterality Date   BREAST BIOPSY Right 01/2015   BREAST EXCISIONAL BIOPSY Right 03/29/2015   BREAST LUMPECTOMY WITH RADIOACTIVE SEED LOCALIZATION Right 03/29/2015   Procedure: RIGHT BREAST LUMPECTOMY WITH RADIOACTIVE SEED LOCALIZATION;  Surgeon: Emelia Loron, MD;  Location: [facility];  Service: General;  Laterality: Right;   COLONOSCOPY     ORIF WRIST FRACTURE  12/24/2010   right   REPAIR PERONEAL TENDONS ANKLE  12/24/2010   TOTAL THYROIDECTOMY  12/24/2006   WISDOM TOOTH EXTRACTION     Patient Active Problem List   Diagnosis Date Noted   Allergic rhinitis 05/05/2021   Anxiety disorder 05/05/2021   Chronic pain 05/05/2021   Asthma 05/05/2021   Depressive disorder 05/05/2021   Dupuytren contracture 05/05/2021   Eczema 05/05/2021   Generalized osteoarthritis 05/05/2021   Hypervitaminosis D 05/05/2021   Hypothyroidism 05/05/2021   Insomnia 05/05/2021   Menopausal symptom 05/05/2021   Primary osteoarthritis, unspecified hand 05/05/2021   Recurrent major depression in remission (HCC) 05/05/2021   Sleep disorder 05/05/2021   Dupuytren's contracture of right hand 08/12/2017   Knee pain 08/25/2013   Personal history of colonic polyps 08/24/2013   Metatarsalgia  of left foot 01/09/2012   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/22/2010    PCP: Selena Batten MD   REFERRING PROVIDER: Hazle Nordmann PA   REFERRING DIAG:   Rationale for Evaluation and Treatment: Rehabilitation  THERAPY DIAG:  Other low back pain  Radiculopathy, lumbar region  ONSET DATE: Intermittent but recent exacerbation about a month ago   SUBJECTIVE:  SUBJECTIVE STATEMENT:  Pt reports her pain level was improving, but either yesterday or Wednesday she had increased pain in L side of low back. Rates at 6/10 at most intense, but can get relief to 2-3/10 with movement. Has trouble with standing upright after sitting. No worsening numbness today, but did have intense N/T last night.    PERTINENT HISTORY:  Hypothyroidism, right lumpectomy, right repair of peroneal tendons.  PAIN:  Are you having pain? Yes: NPRS scale: 6/10 at worst mostly numbness  Pain location: left side of lower back and into the leg  Pain description: aching/ numbness  Aggravating factors: sitting Relieving factors: moving   PRECAUTIONS: None  RED FLAGS: None   WEIGHT BEARING RESTRICTIONS: No  FALLS:  Has patient fallen in last 6 months? 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   FINDINGS: Slight dextrocurvature of the lumbar spine. Preserved vertebral body heights. No acute compression fracture, wedge-shaped deformity or focal kyphosis. Scattered  multilevel degenerative changes with disc space narrowing most pronounced at L4-5 and L5-S1. No instability with limited flexion and extension. SI joints are maintained. Included pelvis intact. Nonobstructive bowel gas pattern.      PATIENT SURVEYS:  FOTO    SCREENING FOR RED FLAGS: Bowel or bladder incontinence: No Spinal tumors: No Cauda equina syndrome: No Compression fracture: No Abdominal aneurysm: No  COGNITION: Overall cognitive status: Within functional limits for tasks assessed     SENSATION: Numbness down posterior aspect of upper left leg  POSTURE: No Significant postural limitations  PALPATION: Significant tenderness to palpation in left and right gluteal and left and right lumbar paraspinal  LUMBAR ROM:   AROM eval  Flexion Full flexion with minor pain at end range  Extension Painful  Right lateral flexion   Left lateral flexion No pain  Right rotation   Left rotation Creates numbness   (Blank rows = not tested)  LOWER EXTREMITY ROM:     Passive Right eval Left eval  Hip flexion  Full  Hip extension    Hip abduction    Hip adduction    Hip internal rotation  Full  Hip external rotation  Full  Knee flexion    Knee extension    Ankle dorsiflexion    Ankle plantarflexion    Ankle inversion    Ankle eversion     (Blank rows = not tested)  LOWER EXTREMITY MMT:    MMT Right eval Left eval  Hip flexion 20.9 20.1   Hip extension    Hip abduction 35.2 34.1  Hip adduction    Hip internal rotation    Hip external rotation    Knee flexion    Knee extension 30.9 36.8  Ankle dorsiflexion    Ankle plantarflexion    Ankle inversion    Ankle eversion     (Blank rows = not tested)   GAIT: No significant gait deviations TODAY'S TREATMENT:  DATE:    8/9 Manual HS stretch Piriformis stretch QL stretch   STM to L  glutes and lumbar ps in sidelying (pillow b/w knees)  HEP update for ITB and QL stretching   8/2 Manual HS stretch Piriformis stretch DKTC 20sec x3 LTR x5ea- 3sec hold PPT 5" x10  STM to bil glutes and lumbar ps in sidelying (pillow b/w knees)  PATIENT EDUCATION:  Education details: Symptom management, self soft tissue mobilization, HEP, POC Person educated: Patient Education method: Explanation, Demonstration, Tactile cues, Verbal cues, and Handouts Education comprehension: verbalized understanding, returned demonstration, verbal cues required, tactile cues required, and needs further education  HOME EXERCISE PROGRAM: Access Code: [email or web address] URL: [email or web address] Date: 07/18/2023 Prepared by: Lorayne Bender  Exercises - Standing Glute Med Mobilization with Small Ball on Wall  - 1 x daily - 7 x weekly - 3 sets - 10 reps - Supine Piriformis Stretch with Foot on Ground  - 1 x daily - 7 x weekly - 3 sets - 10 reps - Supine Bridge  - 1 x daily - 7 x weekly - 3 sets - 10 reps - Hooklying Clamshell with Resistance  - 1 x daily - 7 x weekly - 3 sets - 10 reps  ASSESSMENT:  CLINICAL IMPRESSION: Pt with significant tenderness to palpation of L glutes, lumbar ps, and QL. She felt relief after manual intervention. Instructed pt in QL and ITB stretching as this seemed to relieve some of her tightness. Pt to continue with tennis ball at home for self release. Pt may benefit from DN to L glutes and lumbar area.   Eval: Patient is a 61 old female who presents with a long history of left-sided low back pain, numbness, and radiculopathy into her left leg.  She had recent exacerbation of pain a month ago.  She had a course of prednisone which helped the pain but she continues to have numbness into the leg.  We are able to reproduce the numbness with left rotation.  She also is uncomfortable in extension.  Her x-rays show degeneration at L4-L5 and L5-S1.  She has mild general  weakness gluteals and knee extensors but no significant side-to-side difference.  She has spasming in the bilateral gluteals and bilateral lower lumbar paraspinals.  She would benefit from skilled therapy to reduce radicular symptoms and improve her ability to be able to sit at work without increased pain and numbness.  She would also benefit from a program to reduce incidence of further exacerbation in the future. OBJECTIVE IMPAIRMENTS: decreased activity tolerance, decreased ROM, decreased strength, increased muscle spasms, and pain.   ACTIVITY LIMITATIONS: sitting and standing  PARTICIPATION LIMITATIONS: driving and occupation  PERSONAL FACTORS: 1-2 comorbidities: Lumbar radiculopathy  are also affecting patient's functional outcome.   REHAB POTENTIAL: Good  CLINICAL DECISION MAKING: Evolving/moderate complexity numbness affecting her daily activity  EVALUATION COMPLEXITY: Moderate   GOALS: Goals reviewed with patient? Yes  SHORT TERM GOALS: Target date: 08/15/2023 ]  Patient will report a 50% reduction in numbness into her left patient will leg Baseline: Goal status: INITIAL  2.  Patient will increase gross right lower extremity strength by 5 pounds Baseline:  Goal status: INITIAL  3.  Patient will will be independent with basic HEP Baseline:  Goal status: INITIAL   LONG TERM GOALS: Target date: 09/12/2023    Patient will sit at work without increased pain down her leg. Baseline:  Goal status: INITIAL  2.  Patient will have a full gym and home  program that does not cause increased radicular symptoms into her leg Baseline:  Goal status: INITIAL  3.  Patient will perform all ADLs and IADLs without increased pain or numbness in her back or leg Baseline:  Goal status: INITIAL   PLAN:  PT FREQUENCY: 1-2x/week  PT DURATION: 6 weeks  PLANNED INTERVENTIONS: Therapeutic exercises, Therapeutic activity, Neuromuscular re-education, Balance training, Patient/Family  education, Self Care, Joint mobilization, Aquatic Therapy, Dry Needling, Spinal mobilization, Cryotherapy, Moist heat, Taping, Ultrasound, Manual therapy, and Re-evaluation.  PLAN FOR NEXT SESSION: Consider manual therapy and trigger point dry needling to the lower lumbar spine, consider LAD with monitoring of radicular symptoms.Add 1-2 more supine core stability exercises. Give patient several series of core stability exercises form different positions. Consider progression to the gym. Looking at hips patient is likely more hyper mobile. Focus more on stability.    Donnel Saxon , PTA 08/02/2023, 5:04 PM

## 2023-08-06 ENCOUNTER — Encounter (HOSPITAL_BASED_OUTPATIENT_CLINIC_OR_DEPARTMENT_OTHER): Payer: Self-pay | Admitting: Physical Therapy

## 2023-08-06 ENCOUNTER — Ambulatory Visit (HOSPITAL_BASED_OUTPATIENT_CLINIC_OR_DEPARTMENT_OTHER): Payer: 59 | Admitting: Physical Therapy

## 2023-08-06 DIAGNOSIS — M5459 Other low back pain: Secondary | ICD-10-CM

## 2023-08-06 DIAGNOSIS — M5416 Radiculopathy, lumbar region: Secondary | ICD-10-CM | POA: Diagnosis not present

## 2023-08-06 NOTE — Therapy (Signed)
OUTPATIENT PHYSICAL THERAPY THORACOLUMBAR TREATMENT   Patient Name: [name] MRN: [id number] DOB:02-16-1961, 62 y.o., female Today's Date: 08/06/2023  END OF SESSION:  PT End of Session - 08/06/23 1610     Visit Number 4    Number of Visits 12    Date for PT Re-Evaluation 08/29/23    PT Start Time 1615    PT Stop Time 1700    PT Time Calculation (min) 45 min    Activity Tolerance Patient tolerated treatment well    Behavior During Therapy [facility] for tasks assessed/performed               Past Medical History:  Diagnosis Date   Exercise-induced asthma    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   Hypothyroidism    Seasonal allergies    Wears glasses    Past Surgical History:  Procedure Laterality Date   BREAST BIOPSY Right 01/2015   BREAST EXCISIONAL BIOPSY Right 03/29/2015   BREAST LUMPECTOMY WITH RADIOACTIVE SEED LOCALIZATION Right 03/29/2015   Procedure: RIGHT BREAST LUMPECTOMY WITH RADIOACTIVE SEED LOCALIZATION;  Surgeon: Emelia Loron, MD;  Location: [facility];  Service: General;  Laterality: Right;   COLONOSCOPY     ORIF WRIST FRACTURE  12/24/2010   right   REPAIR PERONEAL TENDONS ANKLE  12/24/2010   TOTAL THYROIDECTOMY  12/24/2006   WISDOM TOOTH EXTRACTION     Patient Active Problem List   Diagnosis Date Noted   Allergic rhinitis 05/05/2021   Anxiety disorder 05/05/2021   Chronic pain 05/05/2021   Asthma 05/05/2021   Depressive disorder 05/05/2021   Dupuytren contracture 05/05/2021   Eczema 05/05/2021   Generalized osteoarthritis 05/05/2021   Hypervitaminosis D 05/05/2021   Hypothyroidism 05/05/2021   Insomnia 05/05/2021   Menopausal symptom 05/05/2021   Primary osteoarthritis, unspecified hand 05/05/2021   Recurrent major depression in remission (HCC) 05/05/2021   Sleep disorder 05/05/2021   Dupuytren's contracture of right hand 08/12/2017   Knee pain 08/25/2013   Personal history of colonic polyps 08/24/2013    Metatarsalgia of left foot 01/09/2012   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/22/2010    PCP: Selena Batten MD   REFERRING PROVIDER: Hazle Nordmann PA   REFERRING DIAG:   Rationale for Evaluation and Treatment: Rehabilitation  THERAPY DIAG:  Other low back pain  Radiculopathy, lumbar region  ONSET DATE: Intermittent but recent exacerbation about a month ago   SUBJECTIVE:  SUBJECTIVE STATEMENT: Pt states the NT is less than last visit. Pt is trying to use heat and massage. The bridge hurts the low back afterwards. Sore from the exercise.      Pt reports her pain level was improving, but either yesterday or Wednesday she had increased pain in L side of low back. Rates at 6/10 at most intense, but can get relief to 2-3/10 with movement. Has trouble with standing upright after sitting. No worsening numbness today, but did have intense N/T last night.    PERTINENT HISTORY:  Hypothyroidism, right lumpectomy, right repair of peroneal tendons.  PAIN:  Are you having pain? Yes: NPRS scale: 2/10 at worst mostly numbness  Pain location: left side of lower back and into the leg  Pain description: aching/ numbness  Aggravating factors: sitting Relieving factors: moving   PRECAUTIONS: None  RED FLAGS: None   WEIGHT BEARING RESTRICTIONS: No  FALLS:  Has patient fallen in last 6 months? 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    FINDINGS: Slight dextrocurvature of the lumbar spine. Preserved vertebral body heights. No acute compression fracture, wedge-shaped deformity or focal kyphosis. Scattered multilevel degenerative changes with disc space narrowing most pronounced at L4-5 and L5-S1. No instability with limited flexion and extension. SI joints are maintained. Included pelvis intact. Nonobstructive bowel gas pattern.      PATIENT SURVEYS:  FOTO    SCREENING FOR RED FLAGS: Bowel or bladder incontinence: No Spinal tumors: No Cauda equina syndrome: No Compression fracture: No Abdominal aneurysm: No  COGNITION: Overall cognitive status: Within functional limits for tasks assessed     SENSATION: Numbness down posterior aspect of upper left leg  POSTURE: No Significant postural limitations  PALPATION: Significant tenderness to palpation in left and right gluteal and left and right lumbar paraspinal  LUMBAR ROM:   AROM eval  Flexion Full flexion with minor pain at end range  Extension Painful  Right lateral flexion   Left lateral flexion No pain  Right rotation   Left rotation Creates numbness   (Blank rows = not tested)  LOWER EXTREMITY ROM:     Passive Right eval Left eval  Hip flexion  Full  Hip extension    Hip abduction    Hip adduction    Hip internal rotation  Full  Hip external rotation  Full  Knee flexion    Knee extension    Ankle dorsiflexion    Ankle plantarflexion    Ankle inversion    Ankle eversion     (Blank rows = not tested)  LOWER EXTREMITY MMT:    MMT Right eval Left eval  Hip flexion 20.9 20.1   Hip extension    Hip abduction 35.2 34.1  Hip adduction    Hip internal rotation    Hip external rotation    Knee flexion    Knee extension 30.9 36.8  Ankle dorsiflexion    Ankle plantarflexion    Ankle inversion    Ankle eversion     (Blank rows = not tested)   GAIT: No significant gait deviations  TODAY'S TREATMENT:                                                                                                                               DATE:    8/13  Trigger Point Dry-Needling  Treatment instructions: Expect mild to moderate muscle soreness. S/S of pneumothorax if dry needled over a lung field, and to seek immediate medical attention should they occur. Patient verbalized understanding of these instructions and education.   Patient Consent Given: Yes Education (verbally)provided: Yes Muscles treated: L L4-5 lumbar paraspinals/multifidi Electrical stimulation performed: no Parameters:   N/A Treatment response/outcome: LTR elicited  STM L lumbar paraspinals and QL UPA and CPA L3-5 grade III     Supine sciatic nerve glide 2x10   Bridge with PPT 10x (form check for home)    Jefferson curl 2x5  8/9 Manual HS stretch Piriformis stretch QL stretch   STM to L glutes and lumbar ps in sidelying (pillow b/w knees)  HEP update for ITB and QL stretching   8/2 Manual HS stretch Piriformis stretch DKTC 20sec x3 LTR x5ea- 3sec hold PPT 5" x10  STM to bil glutes and lumbar ps in sidelying (pillow b/w knees)  PATIENT EDUCATION:  Education details: Symptom management, self soft tissue mobilization, HEP, POC Person educated: Patient Education method: Explanation, Demonstration, Tactile cues, Verbal cues, and Handouts Education comprehension: verbalized understanding, returned demonstration, verbal cues required, tactile cues required, and needs further education  HOME EXERCISE PROGRAM: Access Code: [email or web address] URL: [email or web address] Date: 07/18/2023 Prepared by: Lorayne Bender  Exercises - Standing Glute Med Mobilization with Small Ball on Wall  - 1 x daily - 7 x weekly - 3 sets - 10 reps - Supine Piriformis Stretch with Foot on Ground  - 1 x daily - 7 x weekly - 3 sets - 10 reps - Supine Bridge  - 1 x daily - 7 x weekly - 3 sets - 10 reps - Hooklying Clamshell with Resistance  - 1 x daily - 7 x weekly - 3  sets - 10 reps  ASSESSMENT:  CLINICAL IMPRESSION: Pt with report of improvement in LBP following manual therapy and TPDN. Pt had LTR in L lumbar region and did have less pain with extension following joint mob. Pt bridge technique modified to reduce discomfort. Sciatic n. Glide trialed with good response and reduction of NT following. Flexion based mobility also pain free. Consider repeat of TPDN at next based on pt response and continue with mobility and progressive lumbopelvic loading as tolerated. HEP modified and updated today. Pt would benefit from continued skilled therapy in order to reach goals and maximize functional lumbopelvic strength and ROM for prevention of future pain exacerbation.   Eval: Patient is a 83 old female who presents with a long history of left-sided low back pain, numbness, and radiculopathy into her left leg.  She had recent exacerbation of pain a month ago.  She had a course of prednisone  which helped the pain but she continues to have numbness into the leg.  We are able to reproduce the numbness with left rotation.  She also is uncomfortable in extension.  Her x-rays show degeneration at L4-L5 and L5-S1.  She has mild general weakness gluteals and knee extensors but no significant side-to-side difference.  She has spasming in the bilateral gluteals and bilateral lower lumbar paraspinals.  She would benefit from skilled therapy to reduce radicular symptoms and improve her ability to be able to sit at work without increased pain and numbness.  She would also benefit from a program to reduce incidence of further exacerbation in the future. OBJECTIVE IMPAIRMENTS: decreased activity tolerance, decreased ROM, decreased strength, increased muscle spasms, and pain.   ACTIVITY LIMITATIONS: sitting and standing  PARTICIPATION LIMITATIONS: driving and occupation  PERSONAL FACTORS: 1-2 comorbidities: Lumbar radiculopathy  are also affecting patient's functional outcome.   REHAB  POTENTIAL: Good  CLINICAL DECISION MAKING: Evolving/moderate complexity numbness affecting her daily activity  EVALUATION COMPLEXITY: Moderate   GOALS: Goals reviewed with patient? Yes  SHORT TERM GOALS: Target date: 08/15/2023 ]  Patient will report a 50% reduction in numbness into her left patient will leg Baseline: Goal status: INITIAL  2.  Patient will increase gross right lower extremity strength by 5 pounds Baseline:  Goal status: INITIAL  3.  Patient will will be independent with basic HEP Baseline:  Goal status: INITIAL   LONG TERM GOALS: Target date: 09/12/2023    Patient will sit at work without increased pain down her leg. Baseline:  Goal status: INITIAL  2.  Patient will have a full gym and home program that does not cause increased radicular symptoms into her leg Baseline:  Goal status: INITIAL  3.  Patient will perform all ADLs and IADLs without increased pain or numbness in her back or leg Baseline:  Goal status: INITIAL   PLAN:  PT FREQUENCY: 1-2x/week  PT DURATION: 6 weeks  PLANNED INTERVENTIONS: Therapeutic exercises, Therapeutic activity, Neuromuscular re-education, Balance training, Patient/Family education, Self Care, Joint mobilization, Aquatic Therapy, Dry Needling, Spinal mobilization, Cryotherapy, Moist heat, Taping, Ultrasound, Manual therapy, and Re-evaluation.  PLAN FOR NEXT SESSION: Consider manual therapy and trigger point dry needling to the lower lumbar spine, consider LAD with monitoring of radicular symptoms.Add 1-2 more supine core stability exercises. Give patient several series of core stability exercises form different positions. Consider progression to the gym. Looking at hips patient is likely more hyper mobile. Focus more on stability.    Zebedee Iba, PT 08/06/2023, 5:05 PM

## 2023-08-14 ENCOUNTER — Other Ambulatory Visit (HOSPITAL_COMMUNITY): Payer: Self-pay

## 2023-08-14 ENCOUNTER — Other Ambulatory Visit: Payer: Self-pay

## 2023-08-14 ENCOUNTER — Ambulatory Visit (INDEPENDENT_AMBULATORY_CARE_PROVIDER_SITE_OTHER): Payer: 59 | Admitting: Student

## 2023-08-14 DIAGNOSIS — M5442 Lumbago with sciatica, left side: Secondary | ICD-10-CM | POA: Diagnosis not present

## 2023-08-14 DIAGNOSIS — M5441 Lumbago with sciatica, right side: Secondary | ICD-10-CM

## 2023-08-14 NOTE — Progress Notes (Signed)
Chief Complaint: Sciatic pain     History of Present Illness:   08/14/23: Patient presents today for follow-up evaluation of low back pain.  She reports that she has got significant improvement from the last visit.  The Medrol Dosepak gave her a lot of pain relief.  She has been consistently working with physical therapy and she reports slow but steady improvement.  Her overall pain levels have improved but she still does have occasional radiating numbness and tingling.  The symptoms are generally worst first thing in the morning.  Also noticed increased symptoms shortly after a long walk a few days ago.  Reports good benefit from last PT visit with dry needling.    06/19/23: [name] is a 62 y.o. female presenting today for evaluation of pain and tingling down the left leg.  She does have a long history of low back issues for years.  Has had the symptoms down the left leg for the past few months.  Pain is most commonly felt in the back of the thigh but often does radiate down past the knee into the lateral and anterior lower leg.  It has been bothering her significantly at night and she has had trouble sleeping.  Pain in the low back is overall been mild.  Has been seeing a chiropractor which has not been providing significant relief.  Takes etodolac twice daily.  Denies any groin pain or notable weakness in the left leg.  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   Hypothyroidism    Seasonal allergies    Wears glasses    Past Surgical History:  Procedure Laterality Date   BREAST BIOPSY Right 01/2015   BREAST EXCISIONAL BIOPSY Right 03/29/2015   BREAST LUMPECTOMY WITH RADIOACTIVE SEED LOCALIZATION Right 03/29/2015   Procedure: RIGHT BREAST  LUMPECTOMY WITH RADIOACTIVE SEED LOCALIZATION;  Surgeon: Emelia Loron, MD;  Location: [facility];  Service: General;  Laterality: Right;   COLONOSCOPY     ORIF WRIST FRACTURE  12/24/2010   right   REPAIR PERONEAL TENDONS ANKLE  12/24/2010   TOTAL THYROIDECTOMY  12/24/2006   WISDOM TOOTH EXTRACTION     Social History   Socioeconomic History   Marital status: Married    Spouse name: Not on file   Number of children: Not on file   Years of education: Not on file   Highest education level: Not on file  Occupational History   Not on file  Tobacco Use   Smoking status: Never   Smokeless tobacco: Not on file  Substance and Sexual Activity   Alcohol use: Yes    Comment: occ   Drug use: No   Sexual activity: Not on file  Other Topics Concern   Not on file  Social History Narrative   Not on file   Social Determinants of Health   Financial Resource Strain: Not on file  Food Insecurity: Not on file  Transportation Needs: Not on file  Physical Activity: Not on file  Stress: Not on file  Social Connections: Not on file   No family history on  file. Allergies  Allergen Reactions   Contrast Media [Iodinated Contrast Media] Swelling    BLUE   Amoxicillin Rash   Current Outpatient Medications  Medication Sig Dispense Refill   albuterol (PROVENTIL HFA;VENTOLIN HFA) 108 (90 BASE) MCG/ACT inhaler Inhale into the lungs every 6 (six) hours as needed for wheezing or shortness of breath.     budesonide-formoterol (SYMBICORT) 80-4.5 MCG/ACT inhaler INHALE 2 PUFFS BY MOUTH INTO THE LUNGS ONCE A DAY 20.4 g 1   budesonide-formoterol (SYMBICORT) 80-4.5 MCG/ACT inhaler Inhale 2 puffs into the lungs once a day 20.4 g 1   budesonide-formoterol (SYMBICORT) 80-4.5 MCG/ACT inhaler Inhale 2 puffs into the lungs once a day. 30.6 g 3   buPROPion (WELLBUTRIN XL) 150 MG 24 hr tablet TAKE 1 TABLET BY MOUTH EVERY MORNING 90 tablet 1   buPROPion (WELLBUTRIN XL) 150 MG 24 hr tablet Take 1  tablet by mouth every morning. 90 tablet 1   buPROPion (WELLBUTRIN XL) 150 MG 24 hr tablet Take 1 tablet (150 mg total) by mouth in the morning. 90 tablet 0   calcium carbonate 200 MG capsule Take 250 mg by mouth 2 (two) times daily with a meal.     cholecalciferol (VITAMIN D) 1000 UNITS tablet Take 1,000 Units by mouth daily.     clindamycin (CLEOCIN) 300 MG capsule Take 1 capsule (300 mg total) by mouth 3 (three) times daily. 21 capsule 0   COVID-19 At Home Antigen Test [facility] COVID-19 HOME TEST) KIT use as directed. 4 each 0   COVID-19 mRNA Vac-TriS, Pfizer, (PFIZER-BIONT COVID-19 VAC-TRIS) SUSP injection Inject into the muscle. 0.3 mL 0   cyclobenzaprine (FLEXERIL) 10 MG tablet Take 1 tablet by mouth daily as needed as directed 30 tablet 3   cyclobenzaprine (FLEXERIL) 10 MG tablet Take 1 tablet (10 mg total) by mouth as needed as directed. 30 tablet 3   diclofenac sodium (VOLTAREN) 1 % GEL Apply 4 g topically 4 (four) times daily as needed. 500 g 6   doxycycline (PERIOSTAT) 20 MG tablet Take 1 tablet (20 mg total) by mouth 2 times a day 1 hour before breakfast and 1 hour before evening meal 180 tablet 4   DULERA 200-5 MCG/ACT AERO   11   estradiol (VIVELLE-DOT) 0.05 MG/24HR patch APPLY 1 PATCH TO SKIN TWICE WEEKLY 24 patch 3   estradiol (VIVELLE-DOT) 0.05 MG/24HR patch Place 1 patch (0.05 mg total) onto the skin 2 (two) times a week. 24 patch 3   estradiol (VIVELLE-DOT) 0.05 MG/24HR patch Place 1 patch (0.05 mg total) onto the skin 2 (two) times a week. 24 patch 3   etodolac (LODINE) 400 MG tablet Take 1 tablet (400 mg total) by mouth 2 (two) times daily with a meal as needed. 60 tablet 0   FLUoxetine (PROZAC) 20 MG capsule TAKE 1 CAPSULE BY MOUTH ONCE A DAY IN THE MORNING 90 capsule 1   FLUoxetine (PROZAC) 20 MG capsule Take 1 capsule (20 mg total) by mouth daily. 90 capsule 0   ibuprofen (ADVIL) 200 MG tablet Take 200 mg by mouth every 6 (six) hours as needed.     levocetirizine (XYZAL)  5 MG tablet TAKE 1 TABLET BY MOUTH ONCE DAILY 90 tablet 1   levocetirizine (XYZAL) 5 MG tablet Take 1 tablet by mouth once a day 90 tablet 2   levocetirizine (XYZAL) 5 MG tablet Take 1 tablet (5 mg total) by mouth daily. 90 tablet 1   levothyroxine (SYNTHROID) 150 MCG tablet TAKE 1 TABLET  BY MOUTH ON AN EMPTY STOMACH ONCE A DAY IN THE MORNING 90 tablet 1   levothyroxine (SYNTHROID) 150 MCG tablet TAKE 1 TABLET BY MOUTH ON AN EMPTY STOMACH IN THE MORNING FOR 6 DAYS A WEEK 80 tablet 1   levothyroxine (SYNTHROID) 150 MCG tablet Take 1 tablet (150 mcg) by mouth on an empty stomach 6 days per week and one-half tablet ( ) 1 day per week. 78 tablet 1   Liraglutide -Weight Management (SAXENDA) 18 MG/3ML SOPN INJECT 3 MG UNDER THE SKIN ONCE A DAY 15 mL 0   Liraglutide -Weight Management (SAXENDA) 18 MG/3ML SOPN INJECT 3 MG UNDER THE SKIN ONCE A DAY . 15 mL 6   LORazepam (ATIVAN) 0.5 MG tablet   0   methylPREDNISolone (MEDROL DOSEPAK) 4 MG TBPK tablet Take per packet instructions 21 each 0   phentermine (ADIPEX-P) 37.5 MG tablet Take 0.5-1 tablets (18.75-37.5 mg total) by mouth in the morning before breakfast. 30 tablet 0   progesterone (PROMETRIUM) 100 MG capsule TAKE 1 CAPSULE BY MOUTH ONCE DAILY 90 capsule 3   progesterone (PROMETRIUM) 100 MG capsule TAKE 1 CAPSULE BY MOUTH ONCE DAILY 90 capsule 3   Semaglutide, 1 MG/DOSE, (OZEMPIC, 1 MG/DOSE,) 4 MG/3ML SOPN Inject 1 mg into skin once a week 6 mL 1   Semaglutide, 1 MG/DOSE, (OZEMPIC, 1 MG/DOSE,) 4 MG/3ML SOPN Inject 1 mg into the skin once a week 3 mL 5   Semaglutide,0.25 or 0.5MG /DOS, (OZEMPIC, 0.25 OR 0.5 MG/DOSE,) 2 MG/1.5ML SOPN Inject 0.5 mg (1 pen) Subcutaneous once a week - FD 10/18/21 30 days 3 mL 0   Semaglutide,0.25 or 0.5MG /DOS, (OZEMPIC, 0.25 OR 0.5 MG/DOSE,) 2 MG/1.5ML SOPN Inject 0.25 mg once weekly for 4 weeks then 0.5 mg once weekly for 2 weeks Subcutaneous once a week 30 days 3 mL 0   tirzepatide (ZEPBOUND) 10 MG/0.5ML Pen Inject 10 mg  into the skin once a week. 2 mL 2   tirzepatide (ZEPBOUND) 12.5 MG/0.5ML Pen Inject 12.5 mg into the skin once a week. 2 mL 0   tirzepatide (ZEPBOUND) 12.5 MG/0.5ML Pen Inject 12.5 mg into the skin once a week. 2 mL 0   tirzepatide (ZEPBOUND) 5 MG/0.5ML Pen Inject 5 mg into the skin once a week. 2 mL 0   tirzepatide (ZEPBOUND) 7.5 MG/0.5ML Pen Inject 7.5 mg into the skin every 7 (seven) days. 2 mL 0   traMADol (ULTRAM) 50 MG tablet Take 1 - 2 tablets by mouth every 6 hours as needed for pain 30 tablet 0   zaleplon (SONATA) 10 MG capsule Take by mouth.     No current facility-administered medications for this visit.   No results found.  Review of Systems:   A ROS was performed including pertinent positives and negatives as documented in the HPI.  Physical Exam :   Constitutional: NAD and appears stated age Neurological: Alert and oriented Psych: Appropriate affect and cooperative There were no vitals taken for this visit.   Comprehensive Musculoskeletal Exam:    Lumbar spine and paraspinal muscles are nontender.  Passive hip range of motion to 120 degrees flexion, 40 degrees ER, and 20 degrees IR bilaterally.  Negative straight leg raise bilaterally.  Knee flexion extension strength is 5/5.  Imaging:     Assessment:   62 y.o. female with low back pain and bilateral sciatica.  Symptoms have improved with steroid taper and physical therapy to where now she is mainly experiencing numbness and tingling down the legs with minimal  pain.  Based on her progress, I would like her to continue with physical therapy until maximum benefit has been gained.  Can continue with dry needling as this has seemed to be of benefit.  Discussed that at this point I feel like she can return to clinic on an as needed basis for reevaluation.  Plan :    - Continue physical therapy as planned - Return to clinic as needed     I personally saw and evaluated the patient, and participated in the management and  treatment plan.  Hazle Nordmann, PA-C Orthopedics  This document was dictated using Conservation officer, historic buildings. A reasonable attempt at proof reading has been made to minimize errors.

## 2023-08-15 ENCOUNTER — Encounter (HOSPITAL_COMMUNITY): Payer: Self-pay

## 2023-08-15 ENCOUNTER — Other Ambulatory Visit (HOSPITAL_COMMUNITY): Payer: Self-pay

## 2023-08-15 ENCOUNTER — Other Ambulatory Visit: Payer: Self-pay

## 2023-08-15 MED ORDER — PHENTERMINE HCL 37.5 MG PO TABS
18.7500 mg | ORAL_TABLET | Freq: Every day | ORAL | 0 refills | Status: DC
  Filled 2023-08-15 – 2023-08-19 (×2): qty 30, 30d supply, fill #0

## 2023-08-15 MED ORDER — ZEPBOUND 12.5 MG/0.5ML ~~LOC~~ SOAJ
12.5000 mg | SUBCUTANEOUS | 0 refills | Status: AC
  Filled 2023-08-15: qty 2, 28d supply, fill #0

## 2023-08-15 MED ORDER — FLUOXETINE HCL 20 MG PO CAPS
20.0000 mg | ORAL_CAPSULE | Freq: Every day | ORAL | 0 refills | Status: AC
  Filled 2023-08-15 – 2023-08-19 (×2): qty 90, 90d supply, fill #0

## 2023-08-16 ENCOUNTER — Ambulatory Visit (HOSPITAL_BASED_OUTPATIENT_CLINIC_OR_DEPARTMENT_OTHER): Payer: 59 | Admitting: Physical Therapy

## 2023-08-16 ENCOUNTER — Encounter (HOSPITAL_BASED_OUTPATIENT_CLINIC_OR_DEPARTMENT_OTHER): Payer: Self-pay | Admitting: Physical Therapy

## 2023-08-16 ENCOUNTER — Other Ambulatory Visit: Payer: Self-pay

## 2023-08-16 DIAGNOSIS — M5459 Other low back pain: Secondary | ICD-10-CM | POA: Diagnosis not present

## 2023-08-16 DIAGNOSIS — M5416 Radiculopathy, lumbar region: Secondary | ICD-10-CM | POA: Diagnosis not present

## 2023-08-16 NOTE — Therapy (Signed)
OUTPATIENT PHYSICAL THERAPY THORACOLUMBAR TREATMENT   Patient Name: [name] MRN: [id number] DOB:12-Jul-1961, 62 y.o., female Today's Date: 08/16/2023  END OF SESSION:  PT End of Session - 08/16/23 1014     Visit Number 5    Number of Visits 12    Date for PT Re-Evaluation 08/29/23    PT Start Time 1015    PT Stop Time 1058    PT Time Calculation (min) 43 min    Activity Tolerance Patient tolerated treatment well    Behavior During Therapy [facility] for tasks assessed/performed               Past Medical History:  Diagnosis Date   Exercise-induced asthma    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   Hypothyroidism    Seasonal allergies    Wears glasses    Past Surgical History:  Procedure Laterality Date   BREAST BIOPSY Right 01/2015   BREAST EXCISIONAL BIOPSY Right 03/29/2015   BREAST LUMPECTOMY WITH RADIOACTIVE SEED LOCALIZATION Right 03/29/2015   Procedure: RIGHT BREAST LUMPECTOMY WITH RADIOACTIVE SEED LOCALIZATION;  Surgeon: Emelia Loron, MD;  Location: [facility];  Service: General;  Laterality: Right;   COLONOSCOPY     ORIF WRIST FRACTURE  12/24/2010   right   REPAIR PERONEAL TENDONS ANKLE  12/24/2010   TOTAL THYROIDECTOMY  12/24/2006   WISDOM TOOTH EXTRACTION     Patient Active Problem List   Diagnosis Date Noted   Allergic rhinitis 05/05/2021   Anxiety disorder 05/05/2021   Chronic pain 05/05/2021   Asthma 05/05/2021   Depressive disorder 05/05/2021   Dupuytren contracture 05/05/2021   Eczema 05/05/2021   Generalized osteoarthritis 05/05/2021   Hypervitaminosis D 05/05/2021   Hypothyroidism 05/05/2021   Insomnia 05/05/2021   Menopausal symptom 05/05/2021   Primary osteoarthritis, unspecified hand 05/05/2021   Recurrent major depression in remission (HCC) 05/05/2021   Sleep disorder 05/05/2021   Dupuytren's contracture of right hand 08/12/2017   Knee pain 08/25/2013   Personal history of colonic polyps 08/24/2013    Metatarsalgia of left foot 01/09/2012   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/22/2010    PCP: Selena Batten MD   REFERRING PROVIDER: Hazle Nordmann PA   REFERRING DIAG:   Rationale for Evaluation and Treatment: Rehabilitation  THERAPY DIAG:  Other low back pain  Radiculopathy, lumbar region  ONSET DATE: Intermittent but recent exacerbation about a month ago   SUBJECTIVE:  SUBJECTIVE STATEMENT: Pt states the NT is less than last visit. Pt is trying to use heat and massage. The bridge hurts the low back afterwards. Sore from the exercise.      Pt reports her pain level was improving, but either yesterday or Wednesday she had increased pain in L side of low back. Rates at 6/10 at most intense, but can get relief to 2-3/10 with movement. Has trouble with standing upright after sitting. No worsening numbness today, but did have intense N/T last night.    PERTINENT HISTORY:  Hypothyroidism, right lumpectomy, right repair of peroneal tendons.  PAIN:  Are you having pain? Yes: NPRS scale: 2/10 at worst mostly numbness  Pain location: left side of lower back and into the leg  Pain description: aching/ numbness  Aggravating factors: sitting Relieving factors: moving   PRECAUTIONS: None  RED FLAGS: None   WEIGHT BEARING RESTRICTIONS: No  FALLS:  Has patient fallen in last 6 months? 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    FINDINGS: Slight dextrocurvature of the lumbar spine. Preserved vertebral body heights. No acute compression fracture, wedge-shaped deformity or focal kyphosis. Scattered multilevel degenerative changes with disc space narrowing most pronounced at L4-5 and L5-S1. No instability with limited flexion and extension. SI joints are maintained. Included pelvis intact. Nonobstructive bowel gas pattern.      PATIENT SURVEYS:  FOTO    SCREENING FOR RED FLAGS: Bowel or bladder incontinence: No Spinal tumors: No Cauda equina syndrome: No Compression fracture: No Abdominal aneurysm: No  COGNITION: Overall cognitive status: Within functional limits for tasks assessed     SENSATION: Numbness down posterior aspect of upper left leg  POSTURE: No Significant postural limitations  PALPATION: Significant tenderness to palpation in left and right gluteal and left and right lumbar paraspinal  LUMBAR ROM:   AROM eval  Flexion Full flexion with minor pain at end range  Extension Painful  Right lateral flexion   Left lateral flexion No pain  Right rotation   Left rotation Creates numbness   (Blank rows = not tested)  LOWER EXTREMITY ROM:     Passive Right eval Left eval  Hip flexion  Full  Hip extension    Hip abduction    Hip adduction    Hip internal rotation  Full  Hip external rotation  Full  Knee flexion    Knee extension    Ankle dorsiflexion    Ankle plantarflexion    Ankle inversion    Ankle eversion     (Blank rows = not tested)  LOWER EXTREMITY MMT:    MMT Right eval Left eval  Hip flexion 20.9 20.1   Hip extension    Hip abduction 35.2 34.1  Hip adduction    Hip internal rotation    Hip external rotation    Knee flexion    Knee extension 30.9 36.8  Ankle dorsiflexion    Ankle plantarflexion    Ankle inversion    Ankle eversion     (Blank rows = not tested)   GAIT: No significant gait deviations  TODAY'S TREATMENT:                                                                                                                               DATE:   8/23 Trigger Point Dry-Needling  Treatment instructions: Expect mild to moderate muscle soreness. S/S of pneumothorax if dry needled over a lung field, and to seek immediate medical attention should they occur. Patient verbalized understanding of these instructions and education.   Patient Consent Given: Yes Education (verbally)provided: Yes Muscles treated: L L4-5 lumbar paraspinals/multifidi bilateral gluteals  Electrical stimulation performed: no Parameters:   N/A Treatment response/outcome: LTR elicited  Bridge 2x10  Supine march 1x12 march 1x8 with switch    exercise progression education  Supine clamshell 2x15 green RPE of 4   Reviewed how to grade exercises at home.   8/13  Trigger Point Dry-Needling  Treatment instructions: Expect mild to moderate muscle soreness. S/S of pneumothorax if dry needled over a lung field, and to seek immediate medical attention should they occur. Patient verbalized understanding of these instructions and education.   Patient Consent Given: Yes Education (verbally)provided: Yes Muscles treated: L L4-5 lumbar paraspinals/multifidi Electrical stimulation performed: no Parameters:   N/A Treatment response/outcome: LTR elicited  STM L lumbar paraspinals and QL UPA and CPA L3-5 grade III     Supine sciatic nerve glide 2x10   Bridge with PPT 10x (form check for home)    Jefferson curl 2x5  8/9 Manual HS stretch Piriformis stretch QL stretch   STM to L glutes and lumbar ps in sidelying (pillow b/w knees)  HEP update for ITB and QL stretching   8/2 Manual HS stretch Piriformis stretch DKTC 20sec x3 LTR x5ea- 3sec hold PPT 5" x10  STM to bil glutes and lumbar ps in sidelying (pillow b/w knees)  PATIENT EDUCATION:  Education details: Symptom management, self soft tissue mobilization, HEP, POC Person educated: Patient Education  method: Explanation, Demonstration, Tactile cues, Verbal cues, and Handouts Education comprehension: verbalized understanding, returned demonstration, verbal cues required, tactile cues required, and needs further education  HOME EXERCISE PROGRAM: Access Code: [email or web address] URL: [email or web address] Date: 07/18/2023 Prepared by: Lorayne Bender  Exercises - Standing Glute Med Mobilization with Small Ball on Wall  - 1 x daily - 7 x weekly - 3 sets - 10 reps - Supine Piriformis Stretch with Foot on Ground  - 1 x daily - 7 x weekly - 3 sets - 10 reps - Supine Bridge  - 1 x daily - 7 x weekly - 3 sets - 10 reps - Hooklying Clamshell with Resistance  - 1 x daily - 7 x weekly - 3 sets - 10 reps  ASSESSMENT:  CLINICAL IMPRESSION: The patient tolerated treatment well. She had a large trigger point in her gluteal. We needled both sides and got a great twitch. We reviewed a supine core stability series for her to work on at home. She has done pilaities. We reviewed  RPE and how to grade her exercises proeprly.   Eval: Patient is a 29 old female who presents with a long history of left-sided low back pain, numbness, and radiculopathy into her left leg.  She had recent exacerbation of pain a month ago.  She had a course of prednisone which helped the pain but she continues to have numbness into the leg.  We are able to reproduce the numbness with left rotation.  She also is uncomfortable in extension.  Her x-rays show degeneration at L4-L5 and L5-S1.  She has mild general weakness gluteals and knee extensors but no significant side-to-side difference.  She has spasming in the bilateral gluteals and bilateral lower lumbar paraspinals.  She would benefit from skilled therapy to reduce radicular symptoms and improve her ability to be able to sit at work without increased pain and numbness.  She would also benefit from a program to reduce incidence of further exacerbation in the future. OBJECTIVE  IMPAIRMENTS: decreased activity tolerance, decreased ROM, decreased strength, increased muscle spasms, and pain.   ACTIVITY LIMITATIONS: sitting and standing  PARTICIPATION LIMITATIONS: driving and occupation  PERSONAL FACTORS: 1-2 comorbidities: Lumbar radiculopathy  are also affecting patient's functional outcome.   REHAB POTENTIAL: Good  CLINICAL DECISION MAKING: Evolving/moderate complexity numbness affecting her daily activity  EVALUATION COMPLEXITY: Moderate   GOALS: Goals reviewed with patient? Yes  SHORT TERM GOALS: Target date: 08/15/2023 ]  Patient will report a 50% reduction in numbness into her left patient will leg Baseline: Goal status: INITIAL  2.  Patient will increase gross right lower extremity strength by 5 pounds Baseline:  Goal status: INITIAL  3.  Patient will will be independent with basic HEP Baseline:  Goal status: INITIAL   LONG TERM GOALS: Target date: 09/12/2023    Patient will sit at work without increased pain down her leg. Baseline:  Goal status: INITIAL  2.  Patient will have a full gym and home program that does not cause increased radicular symptoms into her leg Baseline:  Goal status: INITIAL  3.  Patient will perform all ADLs and IADLs without increased pain or numbness in her back or leg Baseline:  Goal status: INITIAL   PLAN:  PT FREQUENCY: 1-2x/week  PT DURATION: 6 weeks  PLANNED INTERVENTIONS: Therapeutic exercises, Therapeutic activity, Neuromuscular re-education, Balance training, Patient/Family education, Self Care, Joint mobilization, Aquatic Therapy, Dry Needling, Spinal mobilization, Cryotherapy, Moist heat, Taping, Ultrasound, Manual therapy, and Re-evaluation.  PLAN FOR NEXT SESSION: Consider manual therapy and trigger point dry needling to the lower lumbar spine, consider LAD with monitoring of radicular symptoms.Add 1-2 more supine core stability exercises. Give patient several series of core stability  exercises form different positions. Consider progression to the gym. Looking at hips patient is likely more hyper mobile. Focus more on stability.    Dessie Coma, PT 08/16/2023, 10:16 AM

## 2023-08-19 ENCOUNTER — Other Ambulatory Visit (HOSPITAL_COMMUNITY): Payer: Self-pay

## 2023-08-20 ENCOUNTER — Other Ambulatory Visit (HOSPITAL_COMMUNITY): Payer: Self-pay

## 2023-08-21 ENCOUNTER — Other Ambulatory Visit (HOSPITAL_COMMUNITY): Payer: Self-pay

## 2023-08-21 DIAGNOSIS — J309 Allergic rhinitis, unspecified: Secondary | ICD-10-CM | POA: Diagnosis not present

## 2023-08-21 DIAGNOSIS — M159 Polyosteoarthritis, unspecified: Secondary | ICD-10-CM | POA: Diagnosis not present

## 2023-08-21 DIAGNOSIS — E039 Hypothyroidism, unspecified: Secondary | ICD-10-CM | POA: Diagnosis not present

## 2023-08-21 DIAGNOSIS — F419 Anxiety disorder, unspecified: Secondary | ICD-10-CM | POA: Diagnosis not present

## 2023-08-21 DIAGNOSIS — Z6826 Body mass index (BMI) 26.0-26.9, adult: Secondary | ICD-10-CM | POA: Diagnosis not present

## 2023-08-21 DIAGNOSIS — E663 Overweight: Secondary | ICD-10-CM | POA: Diagnosis not present

## 2023-08-21 DIAGNOSIS — F3340 Major depressive disorder, recurrent, in remission, unspecified: Secondary | ICD-10-CM | POA: Diagnosis not present

## 2023-08-21 DIAGNOSIS — G4733 Obstructive sleep apnea (adult) (pediatric): Secondary | ICD-10-CM | POA: Diagnosis not present

## 2023-08-21 DIAGNOSIS — G479 Sleep disorder, unspecified: Secondary | ICD-10-CM | POA: Diagnosis not present

## 2023-08-21 DIAGNOSIS — J45991 Cough variant asthma: Secondary | ICD-10-CM | POA: Diagnosis not present

## 2023-08-21 MED ORDER — ZALEPLON 10 MG PO CAPS
10.0000 mg | ORAL_CAPSULE | Freq: Every evening | ORAL | 0 refills | Status: AC | PRN
  Filled 2023-08-21: qty 30, 30d supply, fill #0

## 2023-08-21 MED ORDER — ETODOLAC 400 MG PO TABS
400.0000 mg | ORAL_TABLET | Freq: Two times a day (BID) | ORAL | 1 refills | Status: DC | PRN
  Filled 2023-08-21: qty 180, 90d supply, fill #0
  Filled 2023-11-15: qty 180, 90d supply, fill #1

## 2023-08-21 MED ORDER — BUPROPION HCL ER (XL) 150 MG PO TB24
150.0000 mg | ORAL_TABLET | Freq: Every morning | ORAL | 3 refills | Status: AC
  Filled 2023-10-04: qty 90, 90d supply, fill #0
  Filled 2023-11-15 – 2023-12-28 (×2): qty 90, 90d supply, fill #1
  Filled 2024-03-27: qty 90, 90d supply, fill #2
  Filled 2024-04-05 – 2024-08-18 (×8): qty 90, 90d supply, fill #3

## 2023-08-21 MED ORDER — FLUOXETINE HCL 20 MG PO CAPS
20.0000 mg | ORAL_CAPSULE | Freq: Every day | ORAL | 3 refills | Status: AC
  Filled 2023-10-30 – 2023-11-15 (×2): qty 90, 90d supply, fill #0
  Filled 2024-02-25: qty 90, 90d supply, fill #1
  Filled 2024-05-25: qty 90, 90d supply, fill #2

## 2023-08-21 MED ORDER — LEVOTHYROXINE SODIUM 137 MCG PO TABS
137.0000 ug | ORAL_TABLET | ORAL | 1 refills | Status: DC
Start: 2023-08-21 — End: 2023-12-01
  Filled 2023-08-21: qty 90, 90d supply, fill #0
  Filled 2023-11-15: qty 90, 90d supply, fill #1

## 2023-08-21 MED ORDER — LORAZEPAM 0.5 MG PO TABS
0.2500 mg | ORAL_TABLET | Freq: Two times a day (BID) | ORAL | 0 refills | Status: AC | PRN
  Filled 2023-08-21: qty 30, 15d supply, fill #0

## 2023-08-21 MED ORDER — LEVOCETIRIZINE DIHYDROCHLORIDE 5 MG PO TABS
5.0000 mg | ORAL_TABLET | Freq: Every day | ORAL | 3 refills | Status: AC
  Filled 2023-11-15: qty 90, 90d supply, fill #0
  Filled 2024-02-08: qty 90, 90d supply, fill #1
  Filled 2024-05-25: qty 90, 90d supply, fill #2
  Filled 2024-08-18: qty 90, 90d supply, fill #3

## 2023-08-22 ENCOUNTER — Other Ambulatory Visit: Payer: Self-pay

## 2023-08-22 ENCOUNTER — Other Ambulatory Visit (HOSPITAL_BASED_OUTPATIENT_CLINIC_OR_DEPARTMENT_OTHER): Payer: Self-pay

## 2023-08-22 ENCOUNTER — Other Ambulatory Visit (HOSPITAL_COMMUNITY): Payer: Self-pay

## 2023-08-23 ENCOUNTER — Encounter (HOSPITAL_BASED_OUTPATIENT_CLINIC_OR_DEPARTMENT_OTHER): Payer: Self-pay | Admitting: Physical Therapy

## 2023-08-23 ENCOUNTER — Other Ambulatory Visit (HOSPITAL_COMMUNITY): Payer: Self-pay

## 2023-08-23 ENCOUNTER — Other Ambulatory Visit: Payer: Self-pay

## 2023-08-23 ENCOUNTER — Ambulatory Visit (HOSPITAL_BASED_OUTPATIENT_CLINIC_OR_DEPARTMENT_OTHER): Payer: 59 | Admitting: Physical Therapy

## 2023-08-23 DIAGNOSIS — M5416 Radiculopathy, lumbar region: Secondary | ICD-10-CM | POA: Diagnosis not present

## 2023-08-23 DIAGNOSIS — M5459 Other low back pain: Secondary | ICD-10-CM | POA: Diagnosis not present

## 2023-08-23 NOTE — Therapy (Signed)
OUTPATIENT PHYSICAL THERAPY THORACOLUMBAR TREATMENT/progress note    Patient Name: [name] MRN: [id number] DOB:04-May-1961, 62 y.o., female Today's Date: 08/23/2023  END OF SESSION:  PT End of Session - 08/23/23 1025     Visit Number 6    Number of Visits 12    Date for PT Re-Evaluation 08/29/23    PT Start Time 1015    PT Stop Time 1057    PT Time Calculation (min) 42 min    Activity Tolerance Patient tolerated treatment well    Behavior During Therapy [facility] for tasks assessed/performed               Past Medical History:  Diagnosis Date   Exercise-induced asthma    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   Hypothyroidism    Seasonal allergies    Wears glasses    Past Surgical History:  Procedure Laterality Date   BREAST BIOPSY Right 01/2015   BREAST EXCISIONAL BIOPSY Right 03/29/2015   BREAST LUMPECTOMY WITH RADIOACTIVE SEED LOCALIZATION Right 03/29/2015   Procedure: RIGHT BREAST LUMPECTOMY WITH RADIOACTIVE SEED LOCALIZATION;  Surgeon: Emelia Loron, MD;  Location: [facility];  Service: General;  Laterality: Right;   COLONOSCOPY     ORIF WRIST FRACTURE  12/24/2010   right   REPAIR PERONEAL TENDONS ANKLE  12/24/2010   TOTAL THYROIDECTOMY  12/24/2006   WISDOM TOOTH EXTRACTION     Patient Active Problem List   Diagnosis Date Noted   Allergic rhinitis 05/05/2021   Anxiety disorder 05/05/2021   Chronic pain 05/05/2021   Asthma 05/05/2021   Depressive disorder 05/05/2021   Dupuytren contracture 05/05/2021   Eczema 05/05/2021   Generalized osteoarthritis 05/05/2021   Hypervitaminosis D 05/05/2021   Hypothyroidism 05/05/2021   Insomnia 05/05/2021   Menopausal symptom 05/05/2021   Primary osteoarthritis, unspecified hand 05/05/2021   Recurrent major depression in remission (HCC) 05/05/2021   Sleep disorder 05/05/2021   Dupuytren's contracture of right hand 08/12/2017   Knee pain 08/25/2013   Personal history of colonic polyps 08/24/2013    Metatarsalgia of left foot 01/09/2012   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/22/2010    PCP: Selena Batten MD   REFERRING PROVIDER: Hazle Nordmann PA   REFERRING DIAG:   Rationale for Evaluation and Treatment: Rehabilitation  THERAPY DIAG:  Other low back pain  Radiculopathy, lumbar region  ONSET DATE: Intermittent but recent exacerbation about a month ago   SUBJECTIVE:  SUBJECTIVE STATEMENT: The patient continues to have pain. At times it is better. At times it is still sore. She has no pain this morning but has been having pain in the morning.    Pt reports her pain level was improving, but either yesterday or Wednesday she had increased pain in L side of low back. Rates at 6/10 at most intense, but can get relief to 2-3/10 with movement. Has trouble with standing upright after sitting. No worsening numbness today, but did have intense N/T last night.    PERTINENT HISTORY:  Hypothyroidism, right lumpectomy, right repair of peroneal tendons.  PAIN:  Are you having pain? Yes: NPRS scale: 2/10 at worst mostly numbness  Pain location: left side of lower back and into the leg  Pain description: aching/ numbness  Aggravating factors: sitting Relieving factors: moving   PRECAUTIONS: None  RED FLAGS: None   WEIGHT BEARING RESTRICTIONS: No  FALLS:  Has patient fallen in last 6 months? 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    FINDINGS: Slight dextrocurvature of the lumbar spine. Preserved vertebral body heights. No acute compression fracture, wedge-shaped deformity or focal kyphosis. Scattered multilevel degenerative changes with disc space narrowing most pronounced at L4-5 and L5-S1. No instability with limited flexion and extension. SI joints are maintained. Included pelvis intact. Nonobstructive bowel gas pattern.      PATIENT SURVEYS:  FOTO    SCREENING FOR RED FLAGS: Bowel or bladder incontinence: No Spinal tumors: No Cauda equina syndrome: No Compression fracture: No Abdominal aneurysm: No  COGNITION: Overall cognitive status: Within functional limits for tasks assessed     SENSATION: Numbness down posterior aspect of upper left leg  POSTURE: No Significant postural limitations  PALPATION: Significant tenderness to palpation in left and right gluteal and left and right lumbar paraspinal  LUMBAR ROM:   AROM eval  Flexion Full flexion with minor pain at end range  Extension Painful  Right lateral flexion   Left lateral flexion No pain  Right rotation   Left rotation Creates numbness   (Blank rows = not tested)  LOWER EXTREMITY ROM:     Passive Right eval Left eval  Hip flexion  Full  Hip extension    Hip abduction    Hip adduction    Hip internal rotation  Full  Hip external rotation  Full  Knee flexion    Knee extension    Ankle dorsiflexion    Ankle plantarflexion    Ankle inversion    Ankle eversion     (Blank rows = not tested)  LOWER EXTREMITY MMT:    MMT Right eval Left eval  Hip flexion 20.9 20.1   Hip extension    Hip abduction 35.2 34.1  Hip adduction    Hip internal rotation    Hip external rotation    Knee flexion    Knee extension 30.9 36.8  Ankle dorsiflexion    Ankle plantarflexion    Ankle inversion    Ankle eversion     (Blank rows = not tested)  FOTO: no change GAIT: No  significant gait deviations TODAY'S TREATMENT:                                                                                                                               DATE:   8/30 Trigger Point Dry-Needling  Treatment instructions: Expect mild to moderate muscle soreness. S/S of pneumothorax if dry needled over a lung field, and to seek immediate medical attention should they occur. Patient verbalized understanding of these instructions and education.   Patient Consent Given: Yes Education (verbally)provided: Yes Muscles treated: L L4-5 lumbar paraspinals/multifidi bilateral gluteals  Electrical stimulation performed: no Parameters:   N/A Treatment response/outcome: LTR elicited  Cable Row 2x15 15 lbs  Cable extension 2x15 15 lbs     8/23 Trigger Point Dry-Needling  Treatment instructions: Expect mild to moderate muscle soreness. S/S of pneumothorax if dry needled over a lung field, and to seek immediate medical attention should they occur. Patient verbalized understanding of these instructions and education.   Patient Consent Given: Yes Education (verbally)provided: Yes Muscles treated: L L4-5 lumbar paraspinals/multifidi bilateral gluteals  Electrical stimulation performed: no Parameters:   N/A Treatment response/outcome: LTR elicited  Bridge 2x10  Supine march 1x12 march 1x8 with switch    exercise progression education  Supine clamshell 2x15 green RPE of 4   Reviewed how to grade exercises at home.   8/13  Trigger Point Dry-Needling  Treatment instructions: Expect mild to moderate muscle soreness. S/S of pneumothorax if dry needled over a lung field, and to seek immediate medical attention should they occur. Patient verbalized understanding of these instructions and education.   Patient Consent Given: Yes Education (verbally)provided: Yes Muscles treated: L L4-5 lumbar paraspinals/multifidi Electrical stimulation performed: no Parameters:   N/A Treatment response/outcome: LTR elicited  STM L lumbar paraspinals and QL UPA  and CPA L3-5 grade III     Supine sciatic nerve glide 2x10   Bridge with PPT 10x (form check for home)    Jefferson curl 2x5  8/9 Manual HS stretch Piriformis stretch QL stretch   STM to L glutes and lumbar ps in sidelying (pillow b/w knees)  HEP update for ITB and QL stretching   8/2 Manual HS stretch Piriformis stretch DKTC 20sec x3 LTR x5ea- 3sec hold PPT 5" x10  STM to bil glutes and lumbar ps in sidelying (pillow b/w knees)  PATIENT EDUCATION:  Education details: Symptom management, self soft tissue mobilization, HEP, POC Person educated: Patient Education method: Explanation, Demonstration, Tactile cues, Verbal cues, and Handouts Education comprehension: verbalized understanding, returned demonstration, verbal cues required, tactile cues required, and needs further education  HOME EXERCISE PROGRAM: Access Code: [email or web address] URL: [email or web address] Date: 07/18/2023 Prepared by: Lorayne Bender  Exercises - Standing Glute Med Mobilization with Small Ball on Wall  - 1 x daily - 7 x weekly - 3 sets - 10 reps - Supine Piriformis Stretch with Foot on Ground  - 1 x daily - 7 x weekly - 3 sets -  10 reps - Supine Bridge  - 1 x daily - 7 x weekly - 3 sets - 10 reps - Hooklying Clamshell with Resistance  - 1 x daily - 7 x weekly - 3 sets - 10 reps  ASSESSMENT:  CLINICAL IMPRESSION: The patient continues to have a trigger point in her left lower lumbar paraspinals.  She also has a trigger point in her left upper gluteal.  She reports less tenderness to palpation of this area today.  She did have good twitch response in the area.  The patient was given 2 upper body exercises to work on at home.  She performed them in the gym with cables but was shown how to do at home with a band in the door.  She reports over the past week she has been able to do much because of a busy work schedule.  She reports over the next week she will try to work her program into her life a  little bit more.  Therapy performed progress on patient today.  Her Foto score showed no significant improvement.  She does report overall she does feel like her function is better.  She is having less pain in the morning.  She is working on developing exercise program.  She would benefit from further skilled therapy 1W6.  See below for goal specific progress  Eval: Patient is a 86 old female who presents with a long history of left-sided low back pain, numbness, and radiculopathy into her left leg.  She had recent exacerbation of pain a month ago.  She had a course of prednisone which helped the pain but she continues to have numbness into the leg.  We are able to reproduce the numbness with left rotation.  She also is uncomfortable in extension.  Her x-rays show degeneration at L4-L5 and L5-S1.  She has mild general weakness gluteals and knee extensors but no significant side-to-side difference.  She has spasming in the bilateral gluteals and bilateral lower lumbar paraspinals.  She would benefit from skilled therapy to reduce radicular symptoms and improve her ability to be able to sit at work without increased pain and numbness.  She would also benefit from a program to reduce incidence of further exacerbation in the future. OBJECTIVE IMPAIRMENTS: decreased activity tolerance, decreased ROM, decreased strength, increased muscle spasms, and pain.   ACTIVITY LIMITATIONS: sitting and standing  PARTICIPATION LIMITATIONS: driving and occupation  PERSONAL FACTORS: 1-2 comorbidities: Lumbar radiculopathy  are also affecting patient's functional outcome.   REHAB POTENTIAL: Good  CLINICAL DECISION MAKING: Evolving/moderate complexity numbness affecting her daily activity  EVALUATION COMPLEXITY: Moderate   GOALS: Goals reviewed with patient? Yes  SHORT TERM GOALS: Target date: 08/15/2023 ]  Patient will report a 50% reduction in numbness into her left patient will leg Baseline: Goal status:  Significant improvement in numbness into her leg achieved 8/30  2.  Patient will increase gross right lower extremity strength by 5 pounds Baseline:  Goal status: Not tested today 8/30  3.  Patient will will be independent with basic HEP Baseline:  Goal status: Has basic HEP 8/30   LONG TERM GOALS: Target date: 09/12/2023    Patient will sit at work without increased pain down her leg. Baseline:  Goal status: Improved sitting.  More pain in the morning than when sitting.  8/30  2.  Patient will have a full gym and home program that does not cause increased radicular symptoms into her leg Baseline:  Goal status: Has initiated  gym program 8/30  3.  Patient will perform all ADLs and IADLs without increased pain or numbness in her back or leg Baseline:  Goal status: Performing ADLs with mild pain 8/30   PLAN:  PT FREQUENCY: 1-2x/week  PT DURATION: 6 weeks  PLANNED INTERVENTIONS: Therapeutic exercises, Therapeutic activity, Neuromuscular re-education, Balance training, Patient/Family education, Self Care, Joint mobilization, Aquatic Therapy, Dry Needling, Spinal mobilization, Cryotherapy, Moist heat, Taping, Ultrasound, Manual therapy, and Re-evaluation.  PLAN FOR NEXT SESSION: Consider manual therapy and trigger point dry needling to the lower lumbar spine, consider LAD with monitoring of radicular symptoms.Add 1-2 more supine core stability exercises. Give patient several series of core stability exercises form different positions. Consider progression to the gym. Looking at hips patient is likely more hyper mobile. Focus more on stability.    Dessie Coma, PT 08/23/2023, 10:34 AM

## 2023-08-30 ENCOUNTER — Ambulatory Visit (HOSPITAL_BASED_OUTPATIENT_CLINIC_OR_DEPARTMENT_OTHER): Payer: 59

## 2023-08-30 ENCOUNTER — Encounter (HOSPITAL_BASED_OUTPATIENT_CLINIC_OR_DEPARTMENT_OTHER): Payer: Self-pay

## 2023-09-02 ENCOUNTER — Ambulatory Visit (HOSPITAL_BASED_OUTPATIENT_CLINIC_OR_DEPARTMENT_OTHER): Payer: 59 | Attending: Student | Admitting: Physical Therapy

## 2023-09-02 ENCOUNTER — Encounter (HOSPITAL_BASED_OUTPATIENT_CLINIC_OR_DEPARTMENT_OTHER): Payer: Self-pay | Admitting: Physical Therapy

## 2023-09-02 DIAGNOSIS — M5416 Radiculopathy, lumbar region: Secondary | ICD-10-CM | POA: Diagnosis not present

## 2023-09-02 DIAGNOSIS — G4733 Obstructive sleep apnea (adult) (pediatric): Secondary | ICD-10-CM | POA: Diagnosis not present

## 2023-09-02 DIAGNOSIS — M5459 Other low back pain: Secondary | ICD-10-CM | POA: Insufficient documentation

## 2023-09-02 NOTE — Therapy (Signed)
OUTPATIENT PHYSICAL THERAPY THORACOLUMBAR TREATMENT/progress note    Patient Name: [name] MRN: [id number] DOB:10-Dec-1961, 62 y.o., female Today's Date: 09/02/2023  END OF SESSION:  PT End of Session - 09/02/23 1624     Visit Number 7    Number of Visits 12    Date for PT Re-Evaluation 10/04/23    PT Start Time 1610    PT Stop Time 1650    PT Time Calculation (min) 40 min    Activity Tolerance Patient tolerated treatment well    Behavior During Therapy [facility] for tasks assessed/performed                Past Medical History:  Diagnosis Date   Exercise-induced asthma    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   Hypothyroidism    Seasonal allergies    Wears glasses    Past Surgical History:  Procedure Laterality Date   BREAST BIOPSY Right 01/2015   BREAST EXCISIONAL BIOPSY Right 03/29/2015   BREAST LUMPECTOMY WITH RADIOACTIVE SEED LOCALIZATION Right 03/29/2015   Procedure: RIGHT BREAST LUMPECTOMY WITH RADIOACTIVE SEED LOCALIZATION;  Surgeon: Emelia Loron, MD;  Location: [facility];  Service: General;  Laterality: Right;   COLONOSCOPY     ORIF WRIST FRACTURE  12/24/2010   right   REPAIR PERONEAL TENDONS ANKLE  12/24/2010   TOTAL THYROIDECTOMY  12/24/2006   WISDOM TOOTH EXTRACTION     Patient Active Problem List   Diagnosis Date Noted   Allergic rhinitis 05/05/2021   Anxiety disorder 05/05/2021   Chronic pain 05/05/2021   Asthma 05/05/2021   Depressive disorder 05/05/2021   Dupuytren contracture 05/05/2021   Eczema 05/05/2021   Generalized osteoarthritis 05/05/2021   Hypervitaminosis D 05/05/2021   Hypothyroidism 05/05/2021   Insomnia 05/05/2021   Menopausal symptom 05/05/2021   Primary osteoarthritis, unspecified hand 05/05/2021   Recurrent major depression in remission (HCC) 05/05/2021   Sleep disorder 05/05/2021   Dupuytren's contracture of right hand 08/12/2017   Knee pain 08/25/2013   Personal history of colonic polyps 08/24/2013    Metatarsalgia of left foot 01/09/2012   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/22/2010    PCP: Selena Batten MD   REFERRING PROVIDER: Hazle Nordmann PA   REFERRING DIAG:   Rationale for Evaluation and Treatment: Rehabilitation  THERAPY DIAG:  Radiculopathy, lumbar region  Other low back pain  ONSET DATE: Intermittent but recent exacerbation about a month ago   SUBJECTIVE:  SUBJECTIVE STATEMENT: Pt states the pain is better. She did yardwork over the weekend without significant aggravation of pain. Pt has less NT   Pt reports her pain level was improving, but either yesterday or Wednesday she had increased pain in L side of low back. Rates at 6/10 at most intense, but can get relief to 2-3/10 with movement. Has trouble with standing upright after sitting. No worsening numbness today, but did have intense N/T last night.    PERTINENT HISTORY:  Hypothyroidism, right lumpectomy, right repair of peroneal tendons.  PAIN:  Are you having pain? No: NPRS scale: 0/10 at worst mostly numbness  Pain location: left side of lower back and into the leg  Pain description: aching/ numbness  Aggravating factors: sitting Relieving factors: moving   PRECAUTIONS: None  RED FLAGS: None   WEIGHT BEARING RESTRICTIONS: No  FALLS:  Has patient fallen in last 6 months? 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   FINDINGS: Slight dextrocurvature of  the lumbar spine. Preserved vertebral body heights. No acute compression fracture, wedge-shaped deformity or focal kyphosis. Scattered multilevel degenerative changes with disc space narrowing most pronounced at L4-5 and L5-S1. No instability with limited flexion and extension. SI joints are maintained. Included pelvis intact. Nonobstructive bowel gas pattern.      PATIENT SURVEYS:  FOTO    SCREENING FOR RED FLAGS: Bowel or bladder incontinence: No Spinal tumors: No Cauda equina syndrome: No Compression fracture: No Abdominal aneurysm: No  COGNITION: Overall cognitive status: Within functional limits for tasks assessed     SENSATION: Numbness down posterior aspect of upper left leg  POSTURE: No Significant postural limitations  PALPATION: Significant tenderness to palpation in left and right gluteal and left and right lumbar paraspinal  LUMBAR ROM:   AROM eval  Flexion Full flexion with minor pain at end range  Extension Painful  Right lateral flexion   Left lateral flexion No pain  Right rotation   Left rotation Creates numbness   (Blank rows = not tested)  LOWER EXTREMITY ROM:     Passive Right eval Left eval  Hip flexion  Full  Hip extension    Hip abduction    Hip adduction    Hip internal rotation  Full  Hip external rotation  Full  Knee flexion    Knee extension    Ankle dorsiflexion    Ankle plantarflexion    Ankle inversion    Ankle eversion     (Blank rows = not tested)  LOWER EXTREMITY MMT:    MMT Right eval Left eval  Hip flexion 20.9 20.1   Hip extension    Hip abduction 35.2 34.1  Hip adduction    Hip internal rotation    Hip external rotation    Knee flexion    Knee extension 30.9 36.8  Ankle dorsiflexion    Ankle plantarflexion    Ankle inversion    Ankle eversion     (Blank rows = not tested)  FOTO: no change GAIT: No significant gait deviations TODAY'S TREATMENT:  DATE:    9/9  STM, deep ischemic pressure to L hip rotators  LAD L LE 5 min     6" box step up 2x10  Standing minisquat to table GTB at knees 3x8 RTB paloff 2x10  8" step up attempted- 4/10 pain  Trigger Point Dry-Needling  Treatment instructions: Expect mild to moderate muscle soreness. S/S of pneumothorax if dry needled over a lung field, and to seek immediate medical attention should they occur. Patient verbalized understanding of these instructions and education.   Patient Consent Given: Yes Education (verbally)provided: Yes Muscles treated: L L4-5 lumbar paraspinals/multifidi bilateral gluteals  Electrical stimulation performed: no Parameters:   N/A Treatment response/outcome: LTR elicited  Cable Row 2x15 15 lbs  Cable extension 2x15 15 lbs   8/30 Trigger Point Dry-Needling  Treatment instructions: Expect mild to moderate muscle soreness. S/S of pneumothorax if dry needled over a lung field, and to seek immediate medical attention should they occur. Patient verbalized understanding of these instructions and education.   Patient Consent Given: Yes Education (verbally)provided: Yes Muscles treated: L L4-5 lumbar paraspinals/multifidi bilateral gluteals  Electrical stimulation performed: no Parameters:   N/A Treatment response/outcome: LTR elicited  Cable Row 2x15 15 lbs  Cable extension 2x15 15 lbs     8/23 Trigger Point Dry-Needling  Treatment instructions: Expect mild to moderate muscle soreness. S/S of pneumothorax if dry needled over a lung field, and to seek immediate medical attention should they occur. Patient verbalized understanding of these instructions and education.   Patient Consent Given: Yes Education (verbally)provided: Yes Muscles treated: L L4-5 lumbar paraspinals/multifidi bilateral gluteals  Electrical stimulation performed: no Parameters:   N/A Treatment response/outcome: LTR  elicited  Bridge 2x10  Supine march 1x12 march 1x8 with switch    exercise progression education  Supine clamshell 2x15 green RPE of 4   Reviewed how to grade exercises at home.   8/13  Trigger Point Dry-Needling  Treatment instructions: Expect mild to moderate muscle soreness. S/S of pneumothorax if dry needled over a lung field, and to seek immediate medical attention should they occur. Patient verbalized understanding of these instructions and education.   Patient Consent Given: Yes Education (verbally)provided: Yes Muscles treated: L L4-5 lumbar paraspinals/multifidi Electrical stimulation performed: no Parameters:   N/A Treatment response/outcome: LTR elicited  STM L lumbar paraspinals and QL UPA and CPA L3-5 grade III     Supine sciatic nerve glide 2x10   Bridge with PPT 10x (form check for home)    Jefferson curl 2x5  8/9 Manual HS stretch Piriformis stretch QL stretch   STM to L glutes and lumbar ps in sidelying (pillow b/w knees)  HEP update for ITB and QL stretching   8/2 Manual HS stretch Piriformis stretch DKTC 20sec x3 LTR x5ea- 3sec hold PPT 5" x10  STM to bil glutes and lumbar ps in sidelying (pillow b/w knees)  PATIENT EDUCATION:  Education details: anatomy, exercise progression, desensitization, graded exposure, envelope of function, HEP, POC  Person educated: Patient Education method: Explanation, Demonstration, Tactile cues, Verbal cues, and Handouts Education comprehension: verbalized understanding, returned demonstration, verbal cues required, tactile cues required, and needs further education  HOME EXERCISE PROGRAM: Access Code: [email or web address] URL: [email or web address] Date: 07/18/2023 Prepared by: Lorayne Bender  Exercises - Standing Glute Med Mobilization with Small Ball on Wall  - 1 x daily - 7 x weekly - 3 sets - 10 reps - Supine Piriformis Stretch with Foot on Ground  - 1 x daily - 7 x weekly -  3 sets - 10 reps -  Supine Bridge  - 1 x daily - 7 x weekly - 3 sets - 10 reps - Hooklying Clamshell with Resistance  - 1 x daily - 7 x weekly - 3 sets - 10 reps  ASSESSMENT:  CLINICAL IMPRESSION: Pt session used to progress BW loaded strengthening exercise today as back pain has improved. Pt does have L lateral hip soft tissue stiffness and sensitivity that may also contribute to current radiating symptoms. Pt advised on acceptable threshold for pain during exercise and recovery as well as progression/regression of current exercise program. Pt advised that graded exposure and progressive loading of the lumbopelvic region will likely continue to improve pain as well as her function for daily demands such as stair navigation. Plan to assess for response to updated HEP and continue with progressive loading as tolerated.   Eval: Patient is a 83 old female who presents with a long history of left-sided low back pain, numbness, and radiculopathy into her left leg.  She had recent exacerbation of pain a month ago.  She had a course of prednisone which helped the pain but she continues to have numbness into the leg.  We are able to reproduce the numbness with left rotation.  She also is uncomfortable in extension.  Her x-rays show degeneration at L4-L5 and L5-S1.  She has mild general weakness gluteals and knee extensors but no significant side-to-side difference.  She has spasming in the bilateral gluteals and bilateral lower lumbar paraspinals.  She would benefit from skilled therapy to reduce radicular symptoms and improve her ability to be able to sit at work without increased pain and numbness.  She would also benefit from a program to reduce incidence of further exacerbation in the future. OBJECTIVE IMPAIRMENTS: decreased activity tolerance, decreased ROM, decreased strength, increased muscle spasms, and pain.   ACTIVITY LIMITATIONS: sitting and standing  PARTICIPATION LIMITATIONS: driving and occupation  PERSONAL  FACTORS: 1-2 comorbidities: Lumbar radiculopathy  are also affecting patient's functional outcome.   REHAB POTENTIAL: Good  CLINICAL DECISION MAKING: Evolving/moderate complexity numbness affecting her daily activity  EVALUATION COMPLEXITY: Moderate   GOALS: Goals reviewed with patient? Yes  SHORT TERM GOALS: Target date: 08/15/2023 ]  Patient will report a 50% reduction in numbness into her left patient will leg Baseline: Goal status: Significant improvement in numbness into her leg achieved 8/30  2.  Patient will increase gross right lower extremity strength by 5 pounds Baseline:  Goal status: Not tested today 8/30  3.  Patient will will be independent with basic HEP Baseline:  Goal status: Has basic HEP 8/30   LONG TERM GOALS: Target date: 09/12/2023    Patient will sit at work without increased pain down her leg. Baseline:  Goal status: Improved sitting.  More pain in the morning than when sitting.  8/30  2.  Patient will have a full gym and home program that does not cause increased radicular symptoms into her leg Baseline:  Goal status: Has initiated gym program 8/30  3.  Patient will perform all ADLs and IADLs without increased pain or numbness in her back or leg Baseline:  Goal status: Performing ADLs with mild pain 8/30   PLAN:  PT FREQUENCY: 1-2x/week  PT DURATION: 6 weeks  PLANNED INTERVENTIONS: Therapeutic exercises, Therapeutic activity, Neuromuscular re-education, Balance training, Patient/Family education, Self Care, Joint mobilization, Aquatic Therapy, Dry Needling, Spinal mobilization, Cryotherapy, Moist heat, Taping, Ultrasound, Manual therapy, and Re-evaluation.  PLAN FOR NEXT SESSION: Consider manual  therapy and trigger point dry needling to the lower lumbar spine, consider LAD with monitoring of radicular symptoms.Add 1-2 more supine core stability exercises. Give patient several series of core stability exercises form different positions.  Consider progression to the gym. Looking at hips patient is likely more hyper mobile. Focus more on stability.    Zebedee Iba, PT 09/02/2023, 4:58 PM

## 2023-09-04 ENCOUNTER — Other Ambulatory Visit (HOSPITAL_COMMUNITY): Payer: Self-pay

## 2023-09-04 ENCOUNTER — Encounter (HOSPITAL_COMMUNITY): Payer: Self-pay

## 2023-09-04 DIAGNOSIS — Z6826 Body mass index (BMI) 26.0-26.9, adult: Secondary | ICD-10-CM | POA: Diagnosis not present

## 2023-09-04 DIAGNOSIS — Z8639 Personal history of other endocrine, nutritional and metabolic disease: Secondary | ICD-10-CM | POA: Diagnosis not present

## 2023-09-04 DIAGNOSIS — M5430 Sciatica, unspecified side: Secondary | ICD-10-CM | POA: Diagnosis not present

## 2023-09-04 DIAGNOSIS — E663 Overweight: Secondary | ICD-10-CM | POA: Diagnosis not present

## 2023-09-04 MED ORDER — ZEPBOUND 15 MG/0.5ML ~~LOC~~ SOAJ
15.0000 mg | SUBCUTANEOUS | 0 refills | Status: DC
  Filled 2023-09-04: qty 2, 28d supply, fill #0

## 2023-09-05 ENCOUNTER — Other Ambulatory Visit: Payer: Self-pay

## 2023-09-09 ENCOUNTER — Ambulatory Visit (HOSPITAL_BASED_OUTPATIENT_CLINIC_OR_DEPARTMENT_OTHER): Payer: 59 | Admitting: Physical Therapy

## 2023-09-09 DIAGNOSIS — M5459 Other low back pain: Secondary | ICD-10-CM | POA: Diagnosis not present

## 2023-09-09 DIAGNOSIS — M5416 Radiculopathy, lumbar region: Secondary | ICD-10-CM | POA: Diagnosis not present

## 2023-09-09 NOTE — Therapy (Signed)
OUTPATIENT PHYSICAL THERAPY THORACOLUMBAR TREATMENT/progress note    Patient Name: [name] MRN: [id number] DOB:30-Jul-1961, 62 y.o., female Today's Date: 09/10/2023  END OF SESSION:  PT End of Session - 09/10/23 0834     Visit Number 8    Number of Visits 12    Date for PT Re-Evaluation 10/04/23    PT Start Time 1145    PT Stop Time 1228    PT Time Calculation (min) 43 min    Activity Tolerance Patient tolerated treatment well    Behavior During Therapy [facility] for tasks assessed/performed                 Past Medical History:  Diagnosis Date   Exercise-induced asthma    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   Hypothyroidism    Seasonal allergies    Wears glasses    Past Surgical History:  Procedure Laterality Date   BREAST BIOPSY Right 01/2015   BREAST EXCISIONAL BIOPSY Right 03/29/2015   BREAST LUMPECTOMY WITH RADIOACTIVE SEED LOCALIZATION Right 03/29/2015   Procedure: RIGHT BREAST LUMPECTOMY WITH RADIOACTIVE SEED LOCALIZATION;  Surgeon: Emelia Loron, MD;  Location: [facility];  Service: General;  Laterality: Right;   COLONOSCOPY     ORIF WRIST FRACTURE  12/24/2010   right   REPAIR PERONEAL TENDONS ANKLE  12/24/2010   TOTAL THYROIDECTOMY  12/24/2006   WISDOM TOOTH EXTRACTION     Patient Active Problem List   Diagnosis Date Noted   Allergic rhinitis 05/05/2021   Anxiety disorder 05/05/2021   Chronic pain 05/05/2021   Asthma 05/05/2021   Depressive disorder 05/05/2021   Dupuytren contracture 05/05/2021   Eczema 05/05/2021   Generalized osteoarthritis 05/05/2021   Hypervitaminosis D 05/05/2021   Hypothyroidism 05/05/2021   Insomnia 05/05/2021   Menopausal symptom 05/05/2021   Primary osteoarthritis, unspecified hand 05/05/2021   Recurrent major depression in remission (HCC) 05/05/2021   Sleep disorder 05/05/2021   Dupuytren's contracture of right hand 08/12/2017   Knee pain 08/25/2013   Personal history of colonic polyps  08/24/2013   Metatarsalgia of left foot 01/09/2012   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/22/2010    PCP: Selena Batten MD   REFERRING PROVIDER: Hazle Nordmann PA   REFERRING DIAG:   Rationale for Evaluation and Treatment: Rehabilitation  THERAPY DIAG:  Radiculopathy, lumbar region  Other low back pain  ONSET DATE: Intermittent but recent exacerbation about a month ago   SUBJECTIVE:  SUBJECTIVE STATEMENT: Patient reports after therapy she did her exercises next day.  She had minor muscle soreness but no major issues.  She reports she waited a day and did her exercises again.  After doing her exercises second time she has significant exacerbation in pain.  Exacerbation lasted over the weekend.  A little better this morning but still sore.  She cannot remember specific exercises that cause an increase in soreness.   Pt reports her pain level was improving, but either yesterday or Wednesday she had increased pain in L side of low back. Rates at 6/10 at most intense, but can get relief to 2-3/10 with movement. Has trouble with standing upright after sitting. No worsening numbness today, but did have intense N/T last night.    PERTINENT HISTORY:  Hypothyroidism, right lumpectomy, right repair of peroneal tendons.  PAIN:  Are you having pain? No: NPRS scale: 0/10 at worst mostly numbness  Pain location: left side of lower back and into the leg  Pain description: aching/ numbness  Aggravating factors: sitting Relieving factors: moving   PRECAUTIONS: None  RED FLAGS: None   WEIGHT BEARING RESTRICTIONS: No  FALLS:  Has patient fallen in last 6 months? 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   FINDINGS: Slight dextrocurvature of the lumbar spine. Preserved vertebral body heights. No acute compression fracture, wedge-shaped deformity or focal kyphosis. Scattered multilevel degenerative changes with disc space narrowing most pronounced at L4-5 and L5-S1. No instability with limited flexion and extension. SI joints are maintained. Included pelvis intact. Nonobstructive bowel gas pattern.      PATIENT SURVEYS:  FOTO    SCREENING FOR RED FLAGS: Bowel or bladder incontinence: No Spinal tumors: No Cauda equina syndrome: No Compression fracture: No Abdominal aneurysm: No  COGNITION: Overall cognitive status: Within functional limits for tasks assessed     SENSATION: Numbness down posterior aspect of upper left leg  POSTURE: No Significant postural limitations  PALPATION: Significant tenderness to palpation in left and right gluteal and left and right lumbar paraspinal  LUMBAR ROM:   AROM eval  Flexion Full flexion with minor pain at end range  Extension Painful  Right lateral flexion   Left lateral flexion No pain  Right rotation   Left rotation Creates numbness   (Blank rows = not tested)  LOWER EXTREMITY ROM:     Passive Right eval Left eval  Hip flexion  Full  Hip extension    Hip abduction    Hip adduction    Hip internal rotation  Full  Hip external rotation  Full  Knee flexion    Knee extension    Ankle dorsiflexion    Ankle plantarflexion    Ankle inversion    Ankle eversion     (Blank rows = not tested)  LOWER EXTREMITY MMT:    MMT Right eval Left eval  Hip flexion 20.9 20.1   Hip extension    Hip abduction 35.2 34.1  Hip adduction    Hip internal  rotation    Hip external rotation    Knee  flexion    Knee extension 30.9 36.8  Ankle dorsiflexion    Ankle plantarflexion    Ankle inversion    Ankle eversion     (Blank rows = not tested)  FOTO: no change GAIT: No significant gait deviations TODAY'S TREATMENT:                                                                                                                              DATE:    09/16 Trigger Point Dry-Needling  Treatment instructions: Expect mild to moderate muscle soreness. S/S of pneumothorax if dry needled over a lung field, and to seek immediate medical attention should they occur. Patient verbalized understanding of these instructions and education.   Patient Consent Given: Yes Education (verbally)provided: Yes Muscles treated: L L4-5 lumbar paraspinals/multifidi  Left gluteals  Electrical stimulation performed: no Parameters:   N/A Treatment response/outcome: LTR elicited  Reviewed HEP; paired down the stretches. She kept the ones she is doing on a more regular basis. Reviewed of core exercises.   Row with abdominal breathing 2 x 15 Green Extension shoulder with abdominal breathing 2 x 15 Green  Bridge 2 x 10 All exercises added to HEP 9/9  STM, deep ischemic pressure to L hip rotators  LAD L LE 5 min     6" box step up 2x10  Standing minisquat to table GTB at knees 3x8 RTB paloff 2x10  8" step up attempted- 4/10 pain  Trigger Point Dry-Needling  Treatment instructions: Expect mild to moderate muscle soreness. S/S of pneumothorax if dry needled over a lung field, and to seek immediate medical attention should they occur. Patient verbalized understanding of these instructions and education.   Patient Consent Given: Yes Education (verbally)provided: Yes Muscles treated: L L4-5 lumbar paraspinals/multifidi  Left gluteals  Electrical stimulation performed: no Parameters:   N/A Treatment response/outcome: LTR elicited  Cable Row 2x15 15 lbs   Cable extension 2x15 15 lbs   8/30 Trigger Point Dry-Needling  Treatment instructions: Expect mild to moderate muscle soreness. S/S of pneumothorax if dry needled over a lung field, and to seek immediate medical attention should they occur. Patient verbalized understanding of these instructions and education.   Patient Consent Given: Yes Education (verbally)provided: Yes Muscles treated: L L4-5 lumbar paraspinals/multifidi bilateral gluteals  Electrical stimulation performed: no Parameters:   N/A Treatment response/outcome: LTR elicited  Cable Row 2x15 15 lbs  Cable extension 2x15 15 lbs     8/23 Trigger Point Dry-Needling  Treatment instructions: Expect mild to moderate muscle soreness. S/S of pneumothorax if dry needled over a lung field, and to seek immediate medical attention should they occur. Patient verbalized understanding of these instructions and education.   Patient Consent Given: Yes Education (verbally)provided: Yes Muscles treated: L L4-5 lumbar paraspinals/multifidi bilateral gluteals  Electrical stimulation performed: no Parameters:   N/A Treatment response/outcome: LTR elicited  Bridge 2x10  Supine march 1x12 march 1x8 with switch    exercise progression  education  Supine clamshell 2x15 green RPE of 4   Reviewed how to grade exercises at home.    PATIENT EDUCATION:  Education details: anatomy, exercise progression, desensitization, graded exposure, envelope of function, HEP, POC  Person educated: Patient Education method: Explanation, Demonstration, Tactile cues, Verbal cues, and Handouts Education comprehension: verbalized understanding, returned demonstration, verbal cues required, tactile cues required, and needs further education  HOME EXERCISE PROGRAM: Access Code: [email or web address] URL: [email or web address] Date: 07/18/2023 Prepared by: Lorayne Bender  Exercises - Standing Glute Med Mobilization with Small Ball on Wall  - 1 x daily - 7  x weekly - 3 sets - 10 reps - Supine Piriformis Stretch with Foot on Ground  - 1 x daily - 7 x weekly - 3 sets - 10 reps - Supine Bridge  - 1 x daily - 7 x weekly - 3 sets - 10 reps - Hooklying Clamshell with Resistance  - 1 x daily - 7 x weekly - 3 sets - 10 reps  ASSESSMENT:  CLINICAL IMPRESSION: The patient had an acute exacerbation of pain following the last time she did her exercises. Most of her exercises on her HEP were higher level exercises. We reviewed her HEP and added in eaiser exercises to do if she is having an exacerbation as well as progression with those exercises. She had no significant pain with these.  The patient continues to have a excellent twitch in her lumbar spine with dry needling.  We needled her lower lumbar paraspinals on the left today.  She also had a great twitch in her gluteal.  Therapy advised her to continue with self soft tissue work and potentially heat if she is sore tomorrow.  Next visit is her last 1 scheduled.  We will reassess plan at this time.    Eval: Patient is a 65 old female who presents with a long history of left-sided low back pain, numbness, and radiculopathy into her left leg.  She had recent exacerbation of pain a month ago.  She had a course of prednisone which helped the pain but she continues to have numbness into the leg.  We are able to reproduce the numbness with left rotation.  She also is uncomfortable in extension.  Her x-rays show degeneration at L4-L5 and L5-S1.  She has mild general weakness gluteals and knee extensors but no significant side-to-side difference.  She has spasming in the bilateral gluteals and bilateral lower lumbar paraspinals.  She would benefit from skilled therapy to reduce radicular symptoms and improve her ability to be able to sit at work without increased pain and numbness.  She would also benefit from a program to reduce incidence of further exacerbation in the future. OBJECTIVE IMPAIRMENTS: decreased activity  tolerance, decreased ROM, decreased strength, increased muscle spasms, and pain.   ACTIVITY LIMITATIONS: sitting and standing  PARTICIPATION LIMITATIONS: driving and occupation  PERSONAL FACTORS: 1-2 comorbidities: Lumbar radiculopathy  are also affecting patient's functional outcome.   REHAB POTENTIAL: Good  CLINICAL DECISION MAKING: Evolving/moderate complexity numbness affecting her daily activity  EVALUATION COMPLEXITY: Moderate   GOALS: Goals reviewed with patient? Yes  SHORT TERM GOALS: Target date: 08/15/2023 ]  Patient will report a 50% reduction in numbness into her left patient will leg Baseline: Goal status: Significant improvement in numbness into her leg achieved 8/30  2.  Patient will increase gross right lower extremity strength by 5 pounds Baseline:  Goal status: Not tested today 8/30  3.  Patient will will be independent with basic  HEP Baseline:  Goal status: Has basic HEP 8/30   LONG TERM GOALS: Target date: 09/12/2023    Patient will sit at work without increased pain down her leg. Baseline:  Goal status: Improved sitting.  More pain in the morning than when sitting.  8/30  2.  Patient will have a full gym and home program that does not cause increased radicular symptoms into her leg Baseline:  Goal status: Has initiated gym program 8/30  3.  Patient will perform all ADLs and IADLs without increased pain or numbness in her back or leg Baseline:  Goal status: Performing ADLs with mild pain 8/30   PLAN:  PT FREQUENCY: 1-2x/week  PT DURATION: 6 weeks  PLANNED INTERVENTIONS: Therapeutic exercises, Therapeutic activity, Neuromuscular re-education, Balance training, Patient/Family education, Self Care, Joint mobilization, Aquatic Therapy, Dry Needling, Spinal mobilization, Cryotherapy, Moist heat, Taping, Ultrasound, Manual therapy, and Re-evaluation.  PLAN FOR NEXT SESSION: Consider manual therapy and trigger point dry needling to the lower  lumbar spine, consider LAD with monitoring of radicular symptoms.Add 1-2 more supine core stability exercises. Give patient several series of core stability exercises form different positions. Consider progression to the gym. Looking at hips patient is likely more hyper mobile. Focus more on stability.    Dessie Coma, PT 09/10/2023, 8:35 AM

## 2023-09-10 ENCOUNTER — Encounter (HOSPITAL_BASED_OUTPATIENT_CLINIC_OR_DEPARTMENT_OTHER): Payer: Self-pay | Admitting: Physical Therapy

## 2023-09-17 ENCOUNTER — Other Ambulatory Visit (HOSPITAL_COMMUNITY): Payer: Self-pay

## 2023-09-17 MED ORDER — ESTRADIOL 0.05 MG/24HR TD PTTW
1.0000 | MEDICATED_PATCH | TRANSDERMAL | 0 refills | Status: AC
  Filled 2024-01-23 – 2024-02-25 (×3): qty 24, 84d supply, fill #0

## 2023-09-18 ENCOUNTER — Other Ambulatory Visit (HOSPITAL_COMMUNITY): Payer: Self-pay

## 2023-09-20 ENCOUNTER — Encounter (HOSPITAL_BASED_OUTPATIENT_CLINIC_OR_DEPARTMENT_OTHER): Payer: Self-pay | Admitting: Physical Therapy

## 2023-09-20 ENCOUNTER — Ambulatory Visit (HOSPITAL_BASED_OUTPATIENT_CLINIC_OR_DEPARTMENT_OTHER): Payer: 59 | Admitting: Physical Therapy

## 2023-09-20 DIAGNOSIS — M5459 Other low back pain: Secondary | ICD-10-CM

## 2023-09-20 DIAGNOSIS — M5416 Radiculopathy, lumbar region: Secondary | ICD-10-CM

## 2023-09-20 NOTE — Therapy (Signed)
OUTPATIENT PHYSICAL THERAPY THORACOLUMBAR TREATMENT/progress note    Patient Name: [name] MRN: [id number] DOB:02-26-1961, 62 y.o., female Today's Date: 09/20/2023  END OF SESSION:  PT End of Session - 09/20/23 0818     Visit Number 9    Number of Visits 12    Date for PT Re-Evaluation 10/04/23    PT Start Time 0800    PT Stop Time 0840    PT Time Calculation (min) 40 min    Activity Tolerance Patient tolerated treatment well    Behavior During Therapy [facility] for tasks assessed/performed                  Past Medical History:  Diagnosis Date   Exercise-induced asthma    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   Hypothyroidism    Seasonal allergies    Wears glasses    Past Surgical History:  Procedure Laterality Date   BREAST BIOPSY Right 01/2015   BREAST EXCISIONAL BIOPSY Right 03/29/2015   BREAST LUMPECTOMY WITH RADIOACTIVE SEED LOCALIZATION Right 03/29/2015   Procedure: RIGHT BREAST LUMPECTOMY WITH RADIOACTIVE SEED LOCALIZATION;  Surgeon: Emelia Loron, MD;  Location: [facility];  Service: General;  Laterality: Right;   COLONOSCOPY     ORIF WRIST FRACTURE  12/24/2010   right   REPAIR PERONEAL TENDONS ANKLE  12/24/2010   TOTAL THYROIDECTOMY  12/24/2006   WISDOM TOOTH EXTRACTION     Patient Active Problem List   Diagnosis Date Noted   Allergic rhinitis 05/05/2021   Anxiety disorder 05/05/2021   Chronic pain 05/05/2021   Asthma 05/05/2021   Depressive disorder 05/05/2021   Dupuytren contracture 05/05/2021   Eczema 05/05/2021   Generalized osteoarthritis 05/05/2021   Hypervitaminosis D 05/05/2021   Hypothyroidism 05/05/2021   Insomnia 05/05/2021   Menopausal symptom 05/05/2021   Primary osteoarthritis, unspecified hand 05/05/2021   Recurrent major depression in remission (HCC) 05/05/2021   Sleep disorder 05/05/2021   Dupuytren's contracture of right hand 08/12/2017   Knee pain 08/25/2013   Personal history of colonic polyps  08/24/2013   Metatarsalgia of left foot 01/09/2012   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/22/2010    PCP: Selena Batten MD   REFERRING PROVIDER: Hazle Nordmann PA   REFERRING DIAG:   Rationale for Evaluation and Treatment: Rehabilitation  THERAPY DIAG:  Radiculopathy, lumbar region  Other low back pain  ONSET DATE: Intermittent but recent exacerbation about a month ago   SUBJECTIVE:  SUBJECTIVE STATEMENT: The patient hasn't had pain for several days now. She has had no numbness.    Pt reports her pain level was improving, but either yesterday or Wednesday she had increased pain in L side of low back. Rates at 6/10 at most intense, but can get relief to 2-3/10 with movement. Has trouble with standing upright after sitting. No worsening numbness today, but did have intense N/T last night.    PERTINENT HISTORY:  Hypothyroidism, right lumpectomy, right repair of peroneal tendons.  PAIN:  Are you having pain? No: NPRS scale: 0/10 at worst mostly numbness  Pain location: left side of lower back and into the leg  Pain description: aching/ numbness  Aggravating factors: sitting Relieving factors: moving   PRECAUTIONS: None  RED FLAGS: None   WEIGHT BEARING RESTRICTIONS: No  FALLS:  Has patient fallen in last 6 months? 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   FINDINGS: Slight dextrocurvature of the lumbar spine. Preserved  vertebral body heights. No acute compression fracture, wedge-shaped deformity or focal kyphosis. Scattered multilevel degenerative changes with disc space narrowing most pronounced at L4-5 and L5-S1. No instability with limited flexion and extension. SI joints are maintained. Included pelvis intact. Nonobstructive bowel gas pattern.      PATIENT SURVEYS:  FOTO    SCREENING FOR RED FLAGS: Bowel or bladder incontinence: No Spinal tumors: No Cauda equina syndrome: No Compression fracture: No Abdominal aneurysm: No  COGNITION: Overall cognitive status: Within functional limits for tasks assessed     SENSATION: Numbness down posterior aspect of upper left leg  POSTURE: No Significant postural limitations  PALPATION: Significant tenderness to palpation in left and right gluteal and left and right lumbar paraspinal  LUMBAR ROM:   AROM eval  Flexion Full flexion with minor pain at end range  Extension Painful  Right lateral flexion   Left lateral flexion No pain  Right rotation   Left rotation Creates numbness   (Blank rows = not tested)  LOWER EXTREMITY ROM:     Passive Right eval Left eval  Hip flexion  Full  Hip extension    Hip abduction    Hip adduction    Hip internal rotation  Full  Hip external rotation  Full  Knee flexion    Knee extension    Ankle dorsiflexion    Ankle plantarflexion    Ankle inversion    Ankle eversion     (Blank rows = not tested)  LOWER EXTREMITY MMT:    MMT Right eval Left eval Right 9/7  Left 9/27  Hip flexion 20.9 20.1  26 32.8  Hip extension      Hip abduction 35.2 34.1 37.3 40.3  Hip adduction      Hip internal rotation      Hip external rotation      Knee flexion      Knee extension 30.9 36.8 41.1 46.6  Ankle dorsiflexion      Ankle plantarflexion      Ankle inversion      Ankle eversion       (  Blank rows = not tested)  FOTO: no change GAIT: No significant gait deviations TODAY'S TREATMENT:                                                                                                                               DATE:   09/27  Manual: assessed trigger points but none found.  Supine: LTR x20 Piriformis stretch 3x20 sec hold   Supine bicycle 2x10  SLR 2x10  Supine bridge with blue band 2x15  Supine hip abduction 2x15 blue   Reviewed FOTO and strength testing   Row green  2x10  Shoulder extension green 3x10   Reviewed HEP and updated     09/16 Trigger Point Dry-Needling  Treatment instructions: Expect mild to moderate muscle soreness. S/S of pneumothorax if dry needled over a lung field, and to seek immediate medical attention should they occur. Patient verbalized understanding of these instructions and education.   Patient Consent Given: Yes Education (verbally)provided: Yes Muscles treated: L L4-5 lumbar paraspinals/multifidi  Left gluteals  Electrical stimulation performed: no Parameters:   N/A Treatment response/outcome: LTR elicited  Reviewed HEP; paired down the stretches. She kept the ones she is doing on a more regular basis. Reviewed of core exercises.   Row with abdominal breathing 2 x 15 Green Extension shoulder with abdominal breathing 2 x 15 Green  Bridge 2 x 10 All exercises added to HEP 9/9  STM, deep ischemic pressure to L hip rotators  LAD L LE 5 min     6" box step up 2x10  Standing minisquat to table GTB at knees 3x8 RTB paloff 2x10  8" step up attempted- 4/10 pain  Trigger Point Dry-Needling  Treatment instructions: Expect mild to moderate muscle soreness. S/S of pneumothorax if dry needled over a lung field, and to seek immediate medical attention should they occur. Patient verbalized understanding of these instructions and education.   Patient Consent Given: Yes Education (verbally)provided: Yes Muscles treated: L L4-5 lumbar paraspinals/multifidi  Left gluteals  Electrical stimulation performed: no Parameters:   N/A Treatment  response/outcome: LTR elicited  Cable Row 2x15 15 lbs  Cable extension 2x15 15 lbs   8/30 Trigger Point Dry-Needling  Treatment instructions: Expect mild to moderate muscle soreness. S/S of pneumothorax if dry needled over a lung field, and to seek immediate medical attention should they occur. Patient verbalized understanding of these instructions and education.   Patient Consent Given: Yes Education (verbally)provided: Yes Muscles treated: L L4-5 lumbar paraspinals/multifidi bilateral gluteals  Electrical stimulation performed: no Parameters:   N/A Treatment response/outcome: LTR elicited  Cable Row 2x15 15 lbs  Cable extension 2x15 15 lbs     8/23 Trigger Point Dry-Needling  Treatment instructions: Expect mild to moderate muscle soreness. S/S of pneumothorax if dry needled over a lung field, and to seek immediate medical attention should they occur. Patient verbalized understanding of these instructions and education.   Patient Consent Given: Yes Education (verbally)provided: Yes  Muscles treated: L L4-5 lumbar paraspinals/multifidi bilateral gluteals  Electrical stimulation performed: no Parameters:   N/A Treatment response/outcome: LTR elicited  Bridge 2x10  Supine march 1x12 march 1x8 with switch    exercise progression education  Supine clamshell 2x15 green RPE of 4   Reviewed how to grade exercises at home.    PATIENT EDUCATION:  Education details: anatomy, exercise progression, desensitization, graded exposure, envelope of function, HEP, POC  Person educated: Patient Education method: Explanation, Demonstration, Tactile cues, Verbal cues, and Handouts Education comprehension: verbalized understanding, returned demonstration, verbal cues required, tactile cues required, and needs further education  HOME EXERCISE PROGRAM: Access Code: [email or web address] URL: [email or web address] Date: 07/18/2023 Prepared by: Lorayne Bender  Exercises - Standing Glute  Med Mobilization with Small Ball on Wall  - 1 x daily - 7 x weekly - 3 sets - 10 reps - Supine Piriformis Stretch with Foot on Ground  - 1 x daily - 7 x weekly - 3 sets - 10 reps - Supine Bridge  - 1 x daily - 7 x weekly - 3 sets - 10 reps - Hooklying Clamshell with Resistance  - 1 x daily - 7 x weekly - 3 sets - 10 reps  ASSESSMENT:  CLINICAL IMPRESSION: The patient is progressing well.  She had no palpable trigger points today.  We reviewed her home program.  She will continue with her home program over the next few weeks.  If she has an acute exacerbation of pain she may return for a follow-up visit for manual and needling.  She is otherwise advised to continue to progress her exercises and figure out what is realistic care for her to be able to do over a long-term period of time.  We reviewed and updated her HEP.  If we do not see her back in 4 to 6 weeks we will discharge to HEP.  Eval: Patient is a 35 old female who presents with a long history of left-sided low back pain, numbness, and radiculopathy into her left leg.  She had recent exacerbation of pain a month ago.  She had a course of prednisone which helped the pain but she continues to have numbness into the leg.  We are able to reproduce the numbness with left rotation.  She also is uncomfortable in extension.  Her x-rays show degeneration at L4-L5 and L5-S1.  She has mild general weakness gluteals and knee extensors but no significant side-to-side difference.  She has spasming in the bilateral gluteals and bilateral lower lumbar paraspinals.  She would benefit from skilled therapy to reduce radicular symptoms and improve her ability to be able to sit at work without increased pain and numbness.  She would also benefit from a program to reduce incidence of further exacerbation in the future. OBJECTIVE IMPAIRMENTS: decreased activity tolerance, decreased ROM, decreased strength, increased muscle spasms, and pain.   ACTIVITY LIMITATIONS:  sitting and standing  PARTICIPATION LIMITATIONS: driving and occupation  PERSONAL FACTORS: 1-2 comorbidities: Lumbar radiculopathy  are also affecting patient's functional outcome.   REHAB POTENTIAL: Good  CLINICAL DECISION MAKING: Evolving/moderate complexity numbness affecting her daily activity  EVALUATION COMPLEXITY: Moderate   GOALS: Goals reviewed with patient? Yes  SHORT TERM GOALS: Target date: 08/15/2023 ]  Patient will report a 50% reduction in numbness into her left patient will leg Baseline: Goal status: Significant improvement in numbness into her leg achieved 8/30  2.  Patient will increase gross right lower extremity strength by 5 pounds Baseline:  Goal status: Not  tested today 8/30  3.  Patient will will be independent with basic HEP Baseline:  Goal status: Has basic HEP 8/30   LONG TERM GOALS: Target date: 09/12/2023    Patient will sit at work without increased pain down her leg. Baseline:  Goal status: Improved sitting.  More pain in the morning than when sitting.  8/30  2.  Patient will have a full gym and home program that does not cause increased radicular symptoms into her leg Baseline:  Goal status: Has initiated gym program 8/30  3.  Patient will perform all ADLs and IADLs without increased pain or numbness in her back or leg Baseline:  Goal status: Performing ADLs with mild pain 8/30   PLAN:  PT FREQUENCY: 1-2x/week  PT DURATION: 6 weeks  PLANNED INTERVENTIONS: Therapeutic exercises, Therapeutic activity, Neuromuscular re-education, Balance training, Patient/Family education, Self Care, Joint mobilization, Aquatic Therapy, Dry Needling, Spinal mobilization, Cryotherapy, Moist heat, Taping, Ultrasound, Manual therapy, and Re-evaluation.  PLAN FOR NEXT SESSION: Consider manual therapy and trigger point dry needling to the lower lumbar spine, consider LAD with monitoring of radicular symptoms.Add 1-2 more supine core stability exercises.  Give patient several series of core stability exercises form different positions. Consider progression to the gym. Looking at hips patient is likely more hyper mobile. Focus more on stability.    Dessie Coma, PT 09/20/2023, 11:21 AM

## 2023-09-25 ENCOUNTER — Other Ambulatory Visit (HOSPITAL_BASED_OUTPATIENT_CLINIC_OR_DEPARTMENT_OTHER): Payer: Self-pay

## 2023-09-25 MED ORDER — COMIRNATY 30 MCG/0.3ML IM SUSY
0.3000 mL | PREFILLED_SYRINGE | Freq: Once | INTRAMUSCULAR | 0 refills | Status: AC
  Filled 2023-09-25: qty 0.3, 1d supply, fill #0

## 2023-10-02 DIAGNOSIS — G4733 Obstructive sleep apnea (adult) (pediatric): Secondary | ICD-10-CM | POA: Diagnosis not present

## 2023-10-03 ENCOUNTER — Other Ambulatory Visit (HOSPITAL_COMMUNITY): Payer: Self-pay

## 2023-10-03 DIAGNOSIS — Z6826 Body mass index (BMI) 26.0-26.9, adult: Secondary | ICD-10-CM | POA: Diagnosis not present

## 2023-10-03 DIAGNOSIS — E663 Overweight: Secondary | ICD-10-CM | POA: Diagnosis not present

## 2023-10-03 DIAGNOSIS — Z8639 Personal history of other endocrine, nutritional and metabolic disease: Secondary | ICD-10-CM | POA: Diagnosis not present

## 2023-10-03 DIAGNOSIS — M5430 Sciatica, unspecified side: Secondary | ICD-10-CM | POA: Diagnosis not present

## 2023-10-03 MED ORDER — ZEPBOUND 15 MG/0.5ML ~~LOC~~ SOAJ
15.0000 mg | SUBCUTANEOUS | 2 refills | Status: AC
  Filled 2023-10-03: qty 2, 30d supply, fill #0
  Filled 2023-10-04: qty 2, 28d supply, fill #0
  Filled 2023-10-30: qty 2, 28d supply, fill #1
  Filled 2024-01-23: qty 2, 28d supply, fill #2

## 2023-10-04 ENCOUNTER — Other Ambulatory Visit (HOSPITAL_COMMUNITY): Payer: Self-pay

## 2023-10-04 ENCOUNTER — Other Ambulatory Visit: Payer: Self-pay

## 2023-10-05 ENCOUNTER — Other Ambulatory Visit (HOSPITAL_COMMUNITY): Payer: Self-pay

## 2023-10-07 ENCOUNTER — Other Ambulatory Visit: Payer: Self-pay

## 2023-10-07 ENCOUNTER — Other Ambulatory Visit (HOSPITAL_COMMUNITY): Payer: Self-pay

## 2023-10-07 MED ORDER — PROGESTERONE MICRONIZED 100 MG PO CAPS
100.0000 mg | ORAL_CAPSULE | Freq: Every day | ORAL | 0 refills | Status: AC
  Filled 2023-10-07: qty 90, 90d supply, fill #0

## 2023-10-08 ENCOUNTER — Other Ambulatory Visit (HOSPITAL_COMMUNITY): Payer: Self-pay

## 2023-10-08 ENCOUNTER — Other Ambulatory Visit: Payer: Self-pay

## 2023-10-09 ENCOUNTER — Other Ambulatory Visit: Payer: Self-pay

## 2023-10-09 ENCOUNTER — Other Ambulatory Visit (HOSPITAL_COMMUNITY): Payer: Self-pay

## 2023-10-09 DIAGNOSIS — Z6827 Body mass index (BMI) 27.0-27.9, adult: Secondary | ICD-10-CM | POA: Diagnosis not present

## 2023-10-09 DIAGNOSIS — Z01419 Encounter for gynecological examination (general) (routine) without abnormal findings: Secondary | ICD-10-CM | POA: Diagnosis not present

## 2023-10-09 DIAGNOSIS — Z1231 Encounter for screening mammogram for malignant neoplasm of breast: Secondary | ICD-10-CM | POA: Diagnosis not present

## 2023-10-09 MED ORDER — PROGESTERONE MICRONIZED 100 MG PO CAPS
100.0000 mg | ORAL_CAPSULE | Freq: Every day | ORAL | 3 refills | Status: DC
  Filled 2023-10-09 – 2023-12-25 (×3): qty 90, 90d supply, fill #0
  Filled 2024-04-06: qty 90, 90d supply, fill #1
  Filled 2024-05-21 – 2024-06-23 (×3): qty 90, 90d supply, fill #2
  Filled 2024-10-04: qty 90, 90d supply, fill #3

## 2023-10-09 MED ORDER — ESTRADIOL 0.05 MG/24HR TD PTTW
1.0000 | MEDICATED_PATCH | TRANSDERMAL | 3 refills | Status: DC
  Filled 2023-10-09: qty 24, 84d supply, fill #0
  Filled 2024-05-25 (×2): qty 24, 84d supply, fill #1
  Filled 2024-08-13: qty 24, 84d supply, fill #2

## 2023-10-10 ENCOUNTER — Other Ambulatory Visit: Payer: Self-pay

## 2023-10-14 ENCOUNTER — Other Ambulatory Visit: Payer: Self-pay | Admitting: Obstetrics and Gynecology

## 2023-10-14 DIAGNOSIS — R928 Other abnormal and inconclusive findings on diagnostic imaging of breast: Secondary | ICD-10-CM

## 2023-10-30 ENCOUNTER — Other Ambulatory Visit (HOSPITAL_COMMUNITY): Payer: Self-pay

## 2023-10-31 ENCOUNTER — Other Ambulatory Visit (HOSPITAL_COMMUNITY): Payer: Self-pay

## 2023-10-31 ENCOUNTER — Other Ambulatory Visit: Payer: Self-pay

## 2023-11-01 ENCOUNTER — Ambulatory Visit
Admission: RE | Admit: 2023-11-01 | Discharge: 2023-11-01 | Disposition: A | Discharge status: Home / Self Care | Payer: 59 | Source: Ambulatory Visit | Attending: Obstetrics and Gynecology | Admitting: Obstetrics and Gynecology

## 2023-11-01 DIAGNOSIS — N6002 Solitary cyst of left breast: Secondary | ICD-10-CM | POA: Diagnosis not present

## 2023-11-01 DIAGNOSIS — R928 Other abnormal and inconclusive findings on diagnostic imaging of breast: Secondary | ICD-10-CM

## 2023-11-02 DIAGNOSIS — G4733 Obstructive sleep apnea (adult) (pediatric): Secondary | ICD-10-CM | POA: Diagnosis not present

## 2023-11-04 DIAGNOSIS — G4733 Obstructive sleep apnea (adult) (pediatric): Secondary | ICD-10-CM | POA: Diagnosis not present

## 2023-11-04 DIAGNOSIS — G4734 Idiopathic sleep related nonobstructive alveolar hypoventilation: Secondary | ICD-10-CM | POA: Diagnosis not present

## 2023-11-12 ENCOUNTER — Other Ambulatory Visit (HOSPITAL_COMMUNITY): Payer: Self-pay

## 2023-11-12 MED ORDER — PHENTERMINE HCL 37.5 MG PO TABS
18.7500 mg | ORAL_TABLET | Freq: Every day | ORAL | 0 refills | Status: DC
  Filled 2023-11-12 – 2023-11-28 (×5): qty 30, 30d supply, fill #0

## 2023-11-12 MED ORDER — ZEPBOUND 15 MG/0.5ML ~~LOC~~ SOAJ
15.0000 mg | SUBCUTANEOUS | 2 refills | Status: AC
  Filled 2023-11-12 – 2023-11-21 (×3): qty 2, 28d supply, fill #0
  Filled 2023-12-25: qty 2, 28d supply, fill #1
  Filled 2024-03-18: qty 2, 28d supply, fill #2

## 2023-11-13 ENCOUNTER — Other Ambulatory Visit (HOSPITAL_COMMUNITY): Payer: Self-pay

## 2023-11-14 ENCOUNTER — Other Ambulatory Visit: Payer: Self-pay

## 2023-11-15 ENCOUNTER — Other Ambulatory Visit (HOSPITAL_COMMUNITY): Payer: Self-pay

## 2023-11-15 ENCOUNTER — Other Ambulatory Visit: Payer: Self-pay

## 2023-11-19 ENCOUNTER — Other Ambulatory Visit (HOSPITAL_COMMUNITY): Payer: Self-pay

## 2023-11-22 ENCOUNTER — Other Ambulatory Visit (HOSPITAL_COMMUNITY): Payer: Self-pay

## 2023-11-25 ENCOUNTER — Other Ambulatory Visit: Payer: Self-pay

## 2023-11-25 ENCOUNTER — Other Ambulatory Visit (HOSPITAL_COMMUNITY): Payer: Self-pay

## 2023-11-26 ENCOUNTER — Other Ambulatory Visit (HOSPITAL_COMMUNITY): Payer: Self-pay

## 2023-11-26 DIAGNOSIS — Z6825 Body mass index (BMI) 25.0-25.9, adult: Secondary | ICD-10-CM | POA: Diagnosis not present

## 2023-11-26 DIAGNOSIS — G4733 Obstructive sleep apnea (adult) (pediatric): Secondary | ICD-10-CM | POA: Diagnosis not present

## 2023-11-26 DIAGNOSIS — E663 Overweight: Secondary | ICD-10-CM | POA: Diagnosis not present

## 2023-11-26 DIAGNOSIS — Z8639 Personal history of other endocrine, nutritional and metabolic disease: Secondary | ICD-10-CM | POA: Diagnosis not present

## 2023-11-27 ENCOUNTER — Other Ambulatory Visit (HOSPITAL_COMMUNITY): Payer: Self-pay

## 2023-11-28 ENCOUNTER — Other Ambulatory Visit (HOSPITAL_COMMUNITY): Payer: Self-pay

## 2023-11-29 DIAGNOSIS — E039 Hypothyroidism, unspecified: Secondary | ICD-10-CM | POA: Diagnosis not present

## 2023-12-02 ENCOUNTER — Other Ambulatory Visit (HOSPITAL_COMMUNITY): Payer: Self-pay

## 2023-12-02 MED ORDER — LEVOTHYROXINE SODIUM 125 MCG PO TABS
125.0000 ug | ORAL_TABLET | Freq: Every morning | ORAL | 0 refills | Status: AC
  Filled 2023-12-02: qty 90, 90d supply, fill #0

## 2023-12-04 DIAGNOSIS — G4733 Obstructive sleep apnea (adult) (pediatric): Secondary | ICD-10-CM | POA: Diagnosis not present

## 2023-12-16 DIAGNOSIS — R0902 Hypoxemia: Secondary | ICD-10-CM | POA: Diagnosis not present

## 2023-12-26 ENCOUNTER — Other Ambulatory Visit: Payer: Self-pay

## 2023-12-26 ENCOUNTER — Other Ambulatory Visit (HOSPITAL_COMMUNITY): Payer: Self-pay

## 2023-12-27 ENCOUNTER — Other Ambulatory Visit: Payer: Self-pay

## 2023-12-28 ENCOUNTER — Other Ambulatory Visit (HOSPITAL_COMMUNITY): Payer: Self-pay

## 2023-12-30 ENCOUNTER — Other Ambulatory Visit (HOSPITAL_COMMUNITY): Payer: Self-pay

## 2023-12-30 ENCOUNTER — Encounter (HOSPITAL_COMMUNITY): Payer: Self-pay

## 2024-01-14 DIAGNOSIS — Z6825 Body mass index (BMI) 25.0-25.9, adult: Secondary | ICD-10-CM | POA: Diagnosis not present

## 2024-01-14 DIAGNOSIS — E663 Overweight: Secondary | ICD-10-CM | POA: Diagnosis not present

## 2024-01-14 DIAGNOSIS — G4733 Obstructive sleep apnea (adult) (pediatric): Secondary | ICD-10-CM | POA: Diagnosis not present

## 2024-01-14 DIAGNOSIS — Z8639 Personal history of other endocrine, nutritional and metabolic disease: Secondary | ICD-10-CM | POA: Diagnosis not present

## 2024-01-15 ENCOUNTER — Other Ambulatory Visit (HOSPITAL_COMMUNITY): Payer: Self-pay

## 2024-01-15 ENCOUNTER — Other Ambulatory Visit: Payer: Self-pay

## 2024-01-15 MED ORDER — PHENTERMINE HCL 37.5 MG PO TABS
18.7500 mg | ORAL_TABLET | Freq: Every day | ORAL | 1 refills | Status: DC
  Filled 2024-01-15: qty 30, 30d supply, fill #0
  Filled 2024-02-08 – 2024-02-12 (×3): qty 30, 30d supply, fill #1

## 2024-01-15 MED ORDER — ZEPBOUND 15 MG/0.5ML ~~LOC~~ SOAJ
15.0000 mg | SUBCUTANEOUS | 2 refills | Status: DC
  Filled 2024-01-15 – 2024-02-23 (×4): qty 2, 28d supply, fill #0
  Filled 2024-04-06 – 2024-04-15 (×2): qty 2, 28d supply, fill #1
  Filled 2024-05-08: qty 2, 28d supply, fill #2

## 2024-01-20 ENCOUNTER — Other Ambulatory Visit (HOSPITAL_COMMUNITY): Payer: Self-pay

## 2024-01-23 ENCOUNTER — Other Ambulatory Visit (HOSPITAL_COMMUNITY): Payer: Self-pay

## 2024-01-23 ENCOUNTER — Other Ambulatory Visit: Payer: Self-pay

## 2024-01-28 DIAGNOSIS — H5203 Hypermetropia, bilateral: Secondary | ICD-10-CM | POA: Diagnosis not present

## 2024-01-28 DIAGNOSIS — H524 Presbyopia: Secondary | ICD-10-CM | POA: Diagnosis not present

## 2024-02-08 ENCOUNTER — Other Ambulatory Visit (HOSPITAL_COMMUNITY): Payer: Self-pay

## 2024-02-10 ENCOUNTER — Other Ambulatory Visit: Payer: Self-pay

## 2024-02-11 ENCOUNTER — Other Ambulatory Visit: Payer: Self-pay

## 2024-02-11 ENCOUNTER — Other Ambulatory Visit (HOSPITAL_COMMUNITY): Payer: Self-pay

## 2024-02-11 MED ORDER — ETODOLAC 400 MG PO TABS
400.0000 mg | ORAL_TABLET | Freq: Two times a day (BID) | ORAL | 0 refills | Status: DC | PRN
  Filled 2024-02-11: qty 180, 90d supply, fill #0

## 2024-02-12 ENCOUNTER — Other Ambulatory Visit (HOSPITAL_COMMUNITY): Payer: Self-pay

## 2024-02-12 ENCOUNTER — Other Ambulatory Visit: Payer: Self-pay

## 2024-02-17 DIAGNOSIS — E039 Hypothyroidism, unspecified: Secondary | ICD-10-CM | POA: Diagnosis not present

## 2024-02-17 DIAGNOSIS — Z13220 Encounter for screening for lipoid disorders: Secondary | ICD-10-CM | POA: Diagnosis not present

## 2024-02-17 DIAGNOSIS — Z79899 Other long term (current) drug therapy: Secondary | ICD-10-CM | POA: Diagnosis not present

## 2024-02-19 DIAGNOSIS — L719 Rosacea, unspecified: Secondary | ICD-10-CM | POA: Diagnosis not present

## 2024-02-19 DIAGNOSIS — L821 Other seborrheic keratosis: Secondary | ICD-10-CM | POA: Diagnosis not present

## 2024-02-19 DIAGNOSIS — L578 Other skin changes due to chronic exposure to nonionizing radiation: Secondary | ICD-10-CM | POA: Diagnosis not present

## 2024-02-19 DIAGNOSIS — D1801 Hemangioma of skin and subcutaneous tissue: Secondary | ICD-10-CM | POA: Diagnosis not present

## 2024-02-19 DIAGNOSIS — D239 Other benign neoplasm of skin, unspecified: Secondary | ICD-10-CM | POA: Diagnosis not present

## 2024-02-19 DIAGNOSIS — D225 Melanocytic nevi of trunk: Secondary | ICD-10-CM | POA: Diagnosis not present

## 2024-02-24 ENCOUNTER — Other Ambulatory Visit: Payer: Self-pay

## 2024-02-26 ENCOUNTER — Other Ambulatory Visit (HOSPITAL_COMMUNITY): Payer: Self-pay

## 2024-02-26 ENCOUNTER — Other Ambulatory Visit: Payer: Self-pay

## 2024-03-04 ENCOUNTER — Other Ambulatory Visit (HOSPITAL_COMMUNITY): Payer: Self-pay

## 2024-03-04 DIAGNOSIS — Z6826 Body mass index (BMI) 26.0-26.9, adult: Secondary | ICD-10-CM | POA: Diagnosis not present

## 2024-03-04 DIAGNOSIS — Z8639 Personal history of other endocrine, nutritional and metabolic disease: Secondary | ICD-10-CM | POA: Diagnosis not present

## 2024-03-04 DIAGNOSIS — G4733 Obstructive sleep apnea (adult) (pediatric): Secondary | ICD-10-CM | POA: Diagnosis not present

## 2024-03-04 DIAGNOSIS — E663 Overweight: Secondary | ICD-10-CM | POA: Diagnosis not present

## 2024-03-04 MED ORDER — PHENTERMINE HCL 37.5 MG PO TABS
18.7500 mg | ORAL_TABLET | Freq: Every day | ORAL | 1 refills | Status: DC
  Filled 2024-03-04 – 2024-03-27 (×2): qty 30, 30d supply, fill #0
  Filled 2024-05-08 – 2024-05-09 (×2): qty 30, 30d supply, fill #1

## 2024-03-05 ENCOUNTER — Other Ambulatory Visit (HOSPITAL_COMMUNITY): Payer: Self-pay

## 2024-03-18 ENCOUNTER — Other Ambulatory Visit (HOSPITAL_COMMUNITY): Payer: Self-pay

## 2024-03-18 DIAGNOSIS — G4733 Obstructive sleep apnea (adult) (pediatric): Secondary | ICD-10-CM | POA: Diagnosis not present

## 2024-03-27 ENCOUNTER — Other Ambulatory Visit: Payer: Self-pay

## 2024-03-27 ENCOUNTER — Other Ambulatory Visit (HOSPITAL_COMMUNITY): Payer: Self-pay

## 2024-03-31 ENCOUNTER — Other Ambulatory Visit (HOSPITAL_COMMUNITY): Payer: Self-pay

## 2024-03-31 ENCOUNTER — Other Ambulatory Visit: Payer: Self-pay

## 2024-03-31 MED ORDER — ETODOLAC 400 MG PO TABS
400.0000 mg | ORAL_TABLET | Freq: Two times a day (BID) | ORAL | 0 refills | Status: DC
  Filled 2024-03-31 – 2024-05-14 (×6): qty 180, 90d supply, fill #0

## 2024-04-06 ENCOUNTER — Other Ambulatory Visit (HOSPITAL_COMMUNITY): Payer: Self-pay

## 2024-04-07 ENCOUNTER — Other Ambulatory Visit (HOSPITAL_COMMUNITY): Payer: Self-pay

## 2024-04-07 ENCOUNTER — Other Ambulatory Visit: Payer: Self-pay

## 2024-04-14 DIAGNOSIS — E663 Overweight: Secondary | ICD-10-CM | POA: Diagnosis not present

## 2024-04-14 DIAGNOSIS — Z6826 Body mass index (BMI) 26.0-26.9, adult: Secondary | ICD-10-CM | POA: Diagnosis not present

## 2024-04-14 DIAGNOSIS — Z8639 Personal history of other endocrine, nutritional and metabolic disease: Secondary | ICD-10-CM | POA: Diagnosis not present

## 2024-04-14 DIAGNOSIS — G4733 Obstructive sleep apnea (adult) (pediatric): Secondary | ICD-10-CM | POA: Diagnosis not present

## 2024-04-15 ENCOUNTER — Other Ambulatory Visit (HOSPITAL_COMMUNITY): Payer: Self-pay

## 2024-04-15 ENCOUNTER — Other Ambulatory Visit: Payer: Self-pay

## 2024-05-04 ENCOUNTER — Other Ambulatory Visit (HOSPITAL_COMMUNITY): Payer: Self-pay

## 2024-05-08 ENCOUNTER — Other Ambulatory Visit: Payer: Self-pay

## 2024-05-08 ENCOUNTER — Other Ambulatory Visit (HOSPITAL_COMMUNITY): Payer: Self-pay

## 2024-05-09 ENCOUNTER — Other Ambulatory Visit (HOSPITAL_COMMUNITY): Payer: Self-pay

## 2024-05-11 ENCOUNTER — Other Ambulatory Visit: Payer: Self-pay

## 2024-05-13 ENCOUNTER — Other Ambulatory Visit (HOSPITAL_COMMUNITY): Payer: Self-pay

## 2024-05-14 ENCOUNTER — Other Ambulatory Visit (HOSPITAL_COMMUNITY): Payer: Self-pay

## 2024-05-21 ENCOUNTER — Other Ambulatory Visit (HOSPITAL_COMMUNITY): Payer: Self-pay

## 2024-05-25 ENCOUNTER — Other Ambulatory Visit: Payer: Self-pay

## 2024-05-25 ENCOUNTER — Other Ambulatory Visit (HOSPITAL_COMMUNITY): Payer: Self-pay

## 2024-05-25 MED ORDER — LEVOCETIRIZINE DIHYDROCHLORIDE 5 MG PO TABS
5.0000 mg | ORAL_TABLET | Freq: Every evening | ORAL | 0 refills | Status: DC
  Filled 2024-05-25 – 2024-11-15 (×4): qty 90, 90d supply, fill #0

## 2024-05-25 MED ORDER — FLUOXETINE HCL 20 MG PO CAPS
20.0000 mg | ORAL_CAPSULE | Freq: Every day | ORAL | 0 refills | Status: DC
  Filled 2024-05-25: qty 90, 90d supply, fill #0

## 2024-05-26 ENCOUNTER — Other Ambulatory Visit (HOSPITAL_COMMUNITY): Payer: Self-pay

## 2024-05-28 DIAGNOSIS — Z6826 Body mass index (BMI) 26.0-26.9, adult: Secondary | ICD-10-CM | POA: Diagnosis not present

## 2024-05-28 DIAGNOSIS — G4733 Obstructive sleep apnea (adult) (pediatric): Secondary | ICD-10-CM | POA: Diagnosis not present

## 2024-05-28 DIAGNOSIS — E663 Overweight: Secondary | ICD-10-CM | POA: Diagnosis not present

## 2024-05-28 DIAGNOSIS — Z8639 Personal history of other endocrine, nutritional and metabolic disease: Secondary | ICD-10-CM | POA: Diagnosis not present

## 2024-05-29 ENCOUNTER — Other Ambulatory Visit (HOSPITAL_BASED_OUTPATIENT_CLINIC_OR_DEPARTMENT_OTHER): Payer: Self-pay

## 2024-05-29 ENCOUNTER — Other Ambulatory Visit (HOSPITAL_COMMUNITY): Payer: Self-pay

## 2024-05-29 MED ORDER — ZEPBOUND 15 MG/0.5ML ~~LOC~~ SOAJ
15.0000 mg | SUBCUTANEOUS | 2 refills | Status: AC
  Filled 2024-05-29: qty 2, 28d supply, fill #0
  Filled 2024-06-23 (×2): qty 2, 28d supply, fill #1
  Filled 2024-08-26: qty 2, 28d supply, fill #2

## 2024-05-29 MED ORDER — PHENTERMINE HCL 37.5 MG PO TABS
18.7500 mg | ORAL_TABLET | Freq: Every day | ORAL | 1 refills | Status: AC
  Filled 2024-05-29 – 2024-06-23 (×3): qty 30, 30d supply, fill #0
  Filled 2024-08-26 – 2024-08-27 (×3): qty 30, 30d supply, fill #1

## 2024-06-08 ENCOUNTER — Other Ambulatory Visit (HOSPITAL_COMMUNITY): Payer: Self-pay

## 2024-06-08 ENCOUNTER — Other Ambulatory Visit: Payer: Self-pay

## 2024-06-08 DIAGNOSIS — Z0000 Encounter for general adult medical examination without abnormal findings: Secondary | ICD-10-CM | POA: Diagnosis not present

## 2024-06-08 DIAGNOSIS — F419 Anxiety disorder, unspecified: Secondary | ICD-10-CM | POA: Diagnosis not present

## 2024-06-08 DIAGNOSIS — G4733 Obstructive sleep apnea (adult) (pediatric): Secondary | ICD-10-CM | POA: Diagnosis not present

## 2024-06-08 DIAGNOSIS — J45991 Cough variant asthma: Secondary | ICD-10-CM | POA: Diagnosis not present

## 2024-06-08 DIAGNOSIS — Z13220 Encounter for screening for lipoid disorders: Secondary | ICD-10-CM | POA: Diagnosis not present

## 2024-06-08 DIAGNOSIS — G479 Sleep disorder, unspecified: Secondary | ICD-10-CM | POA: Diagnosis not present

## 2024-06-08 DIAGNOSIS — M159 Polyosteoarthritis, unspecified: Secondary | ICD-10-CM | POA: Diagnosis not present

## 2024-06-08 DIAGNOSIS — L719 Rosacea, unspecified: Secondary | ICD-10-CM | POA: Diagnosis not present

## 2024-06-08 DIAGNOSIS — J309 Allergic rhinitis, unspecified: Secondary | ICD-10-CM | POA: Diagnosis not present

## 2024-06-08 DIAGNOSIS — E039 Hypothyroidism, unspecified: Secondary | ICD-10-CM | POA: Diagnosis not present

## 2024-06-08 MED ORDER — LORAZEPAM 0.5 MG PO TABS
0.2500 mg | ORAL_TABLET | Freq: Two times a day (BID) | ORAL | 0 refills | Status: AC | PRN
  Filled 2024-06-08: qty 30, 15d supply, fill #0

## 2024-06-08 MED ORDER — LEVOCETIRIZINE DIHYDROCHLORIDE 5 MG PO TABS
5.0000 mg | ORAL_TABLET | Freq: Every evening | ORAL | 0 refills | Status: AC
  Filled 2024-06-23 – 2024-08-13 (×3): qty 90, 90d supply, fill #0

## 2024-06-08 MED ORDER — FLUOXETINE HCL 10 MG PO CAPS
10.0000 mg | ORAL_CAPSULE | Freq: Every day | ORAL | 4 refills | Status: AC
  Filled 2024-06-08: qty 90, 90d supply, fill #0
  Filled 2024-06-23 – 2024-08-23 (×2): qty 90, 90d supply, fill #1
  Filled 2024-12-09: qty 90, 90d supply, fill #2

## 2024-06-08 MED ORDER — ETODOLAC 400 MG PO TABS
400.0000 mg | ORAL_TABLET | Freq: Two times a day (BID) | ORAL | 1 refills | Status: AC
  Filled 2024-07-14 – 2024-08-05 (×4): qty 180, 90d supply, fill #0
  Filled 2024-10-23 – 2024-10-28 (×2): qty 180, 90d supply, fill #1

## 2024-06-08 MED ORDER — BUPROPION HCL ER (XL) 150 MG PO TB24
150.0000 mg | ORAL_TABLET | Freq: Every morning | ORAL | 4 refills | Status: AC
  Filled 2024-06-08 – 2024-06-23 (×3): qty 90, 90d supply, fill #0
  Filled 2024-09-16: qty 90, 90d supply, fill #1
  Filled 2024-12-21: qty 90, 90d supply, fill #2

## 2024-06-08 MED ORDER — ZALEPLON 10 MG PO CAPS
10.0000 mg | ORAL_CAPSULE | Freq: Every evening | ORAL | 1 refills | Status: AC | PRN
  Filled 2024-06-08: qty 30, 30d supply, fill #0

## 2024-06-11 ENCOUNTER — Other Ambulatory Visit: Payer: Self-pay

## 2024-06-11 ENCOUNTER — Other Ambulatory Visit (HOSPITAL_COMMUNITY): Payer: Self-pay

## 2024-06-11 MED ORDER — LEVOTHYROXINE SODIUM 125 MCG PO TABS
125.0000 ug | ORAL_TABLET | Freq: Every morning | ORAL | 1 refills | Status: DC
  Filled 2024-06-11 – 2024-07-14 (×2): qty 90, 90d supply, fill #0
  Filled 2024-11-06: qty 90, 90d supply, fill #1

## 2024-06-12 ENCOUNTER — Other Ambulatory Visit: Payer: Self-pay

## 2024-06-12 ENCOUNTER — Other Ambulatory Visit (HOSPITAL_BASED_OUTPATIENT_CLINIC_OR_DEPARTMENT_OTHER): Payer: Self-pay | Admitting: Orthopaedic Surgery

## 2024-06-12 ENCOUNTER — Other Ambulatory Visit (HOSPITAL_COMMUNITY): Payer: Self-pay

## 2024-06-12 ENCOUNTER — Telehealth (HOSPITAL_BASED_OUTPATIENT_CLINIC_OR_DEPARTMENT_OTHER): Payer: Self-pay | Admitting: Student

## 2024-06-12 MED ORDER — METHYLPREDNISOLONE 4 MG PO TBPK
ORAL_TABLET | ORAL | 0 refills | Status: AC
  Filled 2024-06-12 (×2): qty 21, 6d supply, fill #0

## 2024-06-12 NOTE — Telephone Encounter (Signed)
 LMOM stating meds were sent to pharmacy

## 2024-06-12 NOTE — Telephone Encounter (Signed)
 Patient wants to know if she can get medrol  dose pack until she Cleora Daft on Monday

## 2024-06-15 ENCOUNTER — Ambulatory Visit (HOSPITAL_BASED_OUTPATIENT_CLINIC_OR_DEPARTMENT_OTHER): Admitting: Student

## 2024-06-15 ENCOUNTER — Encounter (HOSPITAL_BASED_OUTPATIENT_CLINIC_OR_DEPARTMENT_OTHER): Payer: Self-pay | Admitting: Student

## 2024-06-15 ENCOUNTER — Ambulatory Visit (HOSPITAL_BASED_OUTPATIENT_CLINIC_OR_DEPARTMENT_OTHER)

## 2024-06-15 DIAGNOSIS — G8929 Other chronic pain: Secondary | ICD-10-CM | POA: Diagnosis not present

## 2024-06-15 DIAGNOSIS — M5441 Lumbago with sciatica, right side: Secondary | ICD-10-CM | POA: Diagnosis not present

## 2024-06-15 DIAGNOSIS — M545 Low back pain, unspecified: Secondary | ICD-10-CM | POA: Diagnosis not present

## 2024-06-15 DIAGNOSIS — M5442 Lumbago with sciatica, left side: Secondary | ICD-10-CM

## 2024-06-15 DIAGNOSIS — M47816 Spondylosis without myelopathy or radiculopathy, lumbar region: Secondary | ICD-10-CM | POA: Diagnosis not present

## 2024-06-15 NOTE — Progress Notes (Signed)
 Chief Complaint: Sciatic pain     History of Present Illness:   [name] is a 63 y.o. female here today for evaluation of low back pain and left-sided sciatica.  She was seen 1 year ago in clinic for the same issue, but saw significant improvements with a Medrol  Dosepak followed by physical therapy and dry needling.  She was started on a Medrol  Dosepak about 3 days ago and this has helped some although her symptoms have not resolved.  She does experience pain located on both sides of her back and is having tingling into the posterior left thigh.  Her back pain is constant.  She is experiencing pain down the back of the left leg which skips the knee and travels from the ankle into the foot.  Does report a history of a peroneal tendon repair in 2012.  Describes her symptoms as and electrical sensation going down the leg.   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   Hypothyroidism    Seasonal allergies    Wears glasses    Past Surgical History:  Procedure Laterality Date   BREAST BIOPSY Right 01/2015   BREAST EXCISIONAL BIOPSY Right 03/29/2015   BREAST LUMPECTOMY WITH RADIOACTIVE SEED LOCALIZATION Right 03/29/2015   Procedure: RIGHT BREAST LUMPECTOMY WITH RADIOACTIVE SEED LOCALIZATION;  Surgeon: Donnice Bury, MD;  Location: [facility];  Service: General;  Laterality: Right;   COLONOSCOPY     ORIF WRIST FRACTURE  12/24/2010   right   REPAIR PERONEAL TENDONS ANKLE  12/24/2010   TOTAL THYROIDECTOMY  12/24/2006   WISDOM TOOTH EXTRACTION     Social History   Socioeconomic History   Marital status: Married    Spouse name: Not on file   Number of children: Not on file   Years of education: Not on file   Highest education level: Not on file   Occupational History   Not on file  Tobacco Use   Smoking status: Never   Smokeless tobacco: Not on file  Substance and Sexual Activity   Alcohol use: Yes    Comment: occ   Drug use: No   Sexual activity: Not on file  Other Topics Concern   Not on file  Social History Narrative   Not on file   Social Drivers of Health   Financial Resource Strain: Not on file  Food Insecurity: Not on file  Transportation Needs: Not on file  Physical Activity: Not on file  Stress: Not on file  Social Connections: Not on file   History reviewed. No pertinent family history. Allergies  Allergen Reactions   Contrast Media [Iodinated Contrast Media] Swelling    BLUE   Amoxicillin Rash   Current Outpatient Medications  Medication Sig Dispense Refill   methylPREDNISolone  (MEDROL  DOSEPAK) 4 MG TBPK tablet Follow package instructions 21 tablet 0   albuterol (PROVENTIL HFA;VENTOLIN HFA) 108 (90 BASE) MCG/ACT inhaler Inhale into the lungs every 6 (six) hours as needed for wheezing or shortness of breath.     budesonide -formoterol  (SYMBICORT ) 80-4.5 MCG/ACT inhaler INHALE 2 PUFFS BY MOUTH INTO THE LUNGS ONCE A DAY 20.4 g 1  budesonide -formoterol  (SYMBICORT ) 80-4.5 MCG/ACT inhaler Inhale 2 puffs into the lungs once a day 20.4 g 1   budesonide -formoterol  (SYMBICORT ) 80-4.5 MCG/ACT inhaler Inhale 2 puffs into the lungs once a day. 30.6 g 3   buPROPion  (WELLBUTRIN  XL) 150 MG 24 hr tablet TAKE 1 TABLET BY MOUTH EVERY MORNING 90 tablet 1   buPROPion  (WELLBUTRIN  XL) 150 MG 24 hr tablet Take 1 tablet by mouth every morning. 90 tablet 1   buPROPion  (WELLBUTRIN  XL) 150 MG 24 hr tablet Take 1 tablet (150 mg total) by mouth in the morning. 90 tablet 3   buPROPion  (WELLBUTRIN  XL) 150 MG 24 hr tablet Take 1 tablet (150 mg total) by mouth every morning. 90 tablet 4   calcium carbonate 200 MG capsule Take 250 mg by mouth 2 (two) times daily with a meal.     cholecalciferol (VITAMIN D) 1000 UNITS tablet Take 1,000  Units by mouth daily.     clindamycin  (CLEOCIN ) 300 MG capsule Take 1 capsule (300 mg total) by mouth 3 (three) times daily. 21 capsule 0   COVID-19 At Home Antigen Test [facility] COVID-19 HOME TEST) KIT use as directed. 4 each 0   COVID-19 mRNA Vac-TriS, Pfizer, (PFIZER-BIONT COVID-19 VAC-TRIS) SUSP injection Inject into the muscle. 0.3 mL 0   cyclobenzaprine  (FLEXERIL ) 10 MG tablet Take 1 tablet by mouth daily as needed as directed 30 tablet 3   cyclobenzaprine  (FLEXERIL ) 10 MG tablet Take 1 tablet (10 mg total) by mouth as needed as directed. 30 tablet 3   diclofenac  sodium (VOLTAREN ) 1 % GEL Apply 4 g topically 4 (four) times daily as needed. 500 g 6   doxycycline  (PERIOSTAT ) 20 MG tablet Take 1 tablet (20 mg total) by mouth 2 times a day 1 hour before breakfast and 1 hour before evening meal 180 tablet 4   DULERA 200-5 MCG/ACT AERO   11   estradiol  (DOTTI ) 0.05 MG/24HR patch Place 1 patch (0.05 mg total) onto the skin 2 (two) times a week. 24 patch 0   estradiol  (DOTTI ) 0.05 MG/24HR patch Place 1 patch (0.05 mg total) onto the skin 2 (two) times a week. 24 patch 3   estradiol  (VIVELLE -DOT) 0.05 MG/24HR patch APPLY 1 PATCH TO SKIN TWICE WEEKLY 24 patch 3   estradiol  (VIVELLE -DOT) 0.05 MG/24HR patch Place 1 patch (0.05 mg total) onto the skin 2 (two) times a week. 24 patch 3   etodolac  (LODINE ) 400 MG tablet Take 1 tablet (400 mg total) by mouth 2 (two) times daily with a meal as needed. 60 tablet 0   etodolac  (LODINE ) 400 MG tablet Take 1 tablet (400 mg total) by mouth 2 (two) times daily with food. 180 tablet 1   FLUoxetine  (PROZAC ) 10 MG capsule Take 1 capsule (10 mg total) by mouth daily. 90 capsule 4   FLUoxetine  (PROZAC ) 20 MG capsule TAKE 1 CAPSULE BY MOUTH ONCE A DAY IN THE MORNING 90 capsule 1   FLUoxetine  (PROZAC ) 20 MG capsule Take 1 capsule (20 mg total) by mouth daily. 90 capsule 0   FLUoxetine  (PROZAC ) 20 MG capsule Take 1 capsule (20 mg total) by mouth daily. 90 capsule 0    FLUoxetine  (PROZAC ) 20 MG capsule Take 1 capsule (20 mg total) by mouth daily. 90 capsule 3   ibuprofen (ADVIL) 200 MG tablet Take 200 mg by mouth every 6 (six) hours as needed.     levocetirizine (XYZAL ) 5 MG tablet TAKE 1 TABLET BY MOUTH ONCE DAILY 90 tablet 1  levocetirizine (XYZAL ) 5 MG tablet Take 1 tablet by mouth once a day 90 tablet 2   levocetirizine (XYZAL ) 5 MG tablet Take 1 tablet (5 mg total) by mouth daily. 90 tablet 1   levocetirizine (XYZAL ) 5 MG tablet Take 1 tablet (5 mg total) by mouth daily. 90 tablet 3   levocetirizine (XYZAL ) 5 MG tablet Take 1 tablet (5 mg total) by mouth daily. 90 tablet 0   levocetirizine (XYZAL ) 5 MG tablet Take 1 tablet (5 mg total) by mouth every evening. 90 tablet 0   levothyroxine  (SYNTHROID ) 125 MCG tablet Take 1 tablet (125 mcg total) by mouth in the morning on an empty stomach. 90 tablet 0   levothyroxine  (SYNTHROID ) 125 MCG tablet Take 1 tablet (125 mcg total) by mouth every morning on an empty stomach. 90 tablet 1   levothyroxine  (SYNTHROID ) 150 MCG tablet TAKE 1 TABLET BY MOUTH ON AN EMPTY STOMACH ONCE A DAY IN THE MORNING 90 tablet 1   levothyroxine  (SYNTHROID ) 150 MCG tablet TAKE 1 TABLET BY MOUTH ON AN EMPTY STOMACH IN THE MORNING FOR 6 DAYS A WEEK 80 tablet 1   Liraglutide  -Weight Management (SAXENDA ) 18 MG/3ML SOPN INJECT 3 MG UNDER THE SKIN ONCE A DAY 15 mL 0   Liraglutide  -Weight Management (SAXENDA ) 18 MG/3ML SOPN INJECT 3 MG UNDER THE SKIN ONCE A DAY . 15 mL 6   LORazepam  (ATIVAN ) 0.5 MG tablet   0   LORazepam  (ATIVAN ) 0.5 MG tablet Take 0.5-1 tablets (0.25-0.5 mg total) by mouth 2 (two) times daily as needed. Use rarely. 30 tablet 0   LORazepam  (ATIVAN ) 0.5 MG tablet Take 0.5-1 tablets (0.25-0.5 mg total) by mouth 2 (two) times daily as needed. 30 tablet 0   methylPREDNISolone  (MEDROL  DOSEPAK) 4 MG TBPK tablet Take per packet instructions 21 each 0   phentermine  (ADIPEX-P ) 37.5 MG tablet Take 0.5-1 tablets (18.75-37.5 mg total) by  mouth daily before breakfast. 30 tablet 1   progesterone  (PROMETRIUM ) 100 MG capsule TAKE 1 CAPSULE BY MOUTH ONCE DAILY 90 capsule 3   progesterone  (PROMETRIUM ) 100 MG capsule TAKE 1 CAPSULE BY MOUTH ONCE DAILY 90 capsule 3   progesterone  (PROMETRIUM ) 100 MG capsule Take 1 capsule (100 mg total) by mouth daily. 90 capsule 0   progesterone  (PROMETRIUM ) 100 MG capsule Take 1 capsule (100 mg total) by mouth daily. 90 capsule 3   Semaglutide , 1 MG/DOSE, (OZEMPIC , 1 MG/DOSE,) 4 MG/3ML SOPN Inject 1 mg into skin once a week 6 mL 1   Semaglutide , 1 MG/DOSE, (OZEMPIC , 1 MG/DOSE,) 4 MG/3ML SOPN Inject 1 mg into the skin once a week 3 mL 5   Semaglutide ,0.25 or 0.5MG /DOS, (OZEMPIC , 0.25 OR 0.5 MG/DOSE,) 2 MG/1.5ML SOPN Inject 0.5 mg (1 pen) Subcutaneous once a week - FD 10/18/21 30 days 3 mL 0   Semaglutide ,0.25 or 0.5MG /DOS, (OZEMPIC , 0.25 OR 0.5 MG/DOSE,) 2 MG/1.5ML SOPN Inject 0.25 mg once weekly for 4 weeks then 0.5 mg once weekly for 2 weeks Subcutaneous once a week 30 days 3 mL 0   tirzepatide  (ZEPBOUND ) 10 MG/0.5ML Pen Inject 10 mg into the skin once a week. 2 mL 2   tirzepatide  (ZEPBOUND ) 12.5 MG/0.5ML Pen Inject 12.5 mg into the skin once a week. 2 mL 0   tirzepatide  (ZEPBOUND ) 12.5 MG/0.5ML Pen Inject 12.5 mg into the skin once a week. 2 mL 0   tirzepatide  (ZEPBOUND ) 12.5 MG/0.5ML Pen Inject 12.5 mg into the skin once a week. 2 mL 0   tirzepatide  (ZEPBOUND )  15 MG/0.5ML Pen Inject 15 mg into the skin once a week. 2 mL 2   tirzepatide  (ZEPBOUND ) 15 MG/0.5ML Pen Inject 15 mg into the skin once a week. 2 mL 2   tirzepatide  (ZEPBOUND ) 15 MG/0.5ML Pen Inject 15 mg into the skin once a week. 2 mL 2   tirzepatide  (ZEPBOUND ) 5 MG/0.5ML Pen Inject 5 mg into the skin once a week. 2 mL 0   tirzepatide  (ZEPBOUND ) 7.5 MG/0.5ML Pen Inject 7.5 mg into the skin every 7 (seven) days. 2 mL 0   traMADol  (ULTRAM ) 50 MG tablet Take 1 - 2 tablets by mouth every 6 hours as needed for pain 30 tablet 0   zaleplon  (SONATA )  10 MG capsule Take by mouth.     zaleplon  (SONATA ) 10 MG capsule Take 1 capsule (10 mg total) by mouth at bedtime as needed. 30 capsule 0   zaleplon  (SONATA ) 10 MG capsule Take 1 capsule (10 mg total) by mouth at bedtime as needed. 30 capsule 1   No current facility-administered medications for this visit.   No results found.  Review of Systems:   A ROS was performed including pertinent positives and negatives as documented in the HPI.  Physical Exam :   Constitutional: NAD and appears stated age Neurological: Alert and oriented Psych: Appropriate affect and cooperative There were no vitals taken for this visit.   Comprehensive Musculoskeletal Exam:    Tenderness over the bilateral lumbar paraspinal musculature without any significant midline tenderness.  Bilateral knee flexion/extension ankle dorsiflexion/plantarflexion strength is 5/5.  Negative straight leg raise on the left side.  Imaging:   Xray (lumbar spine 4 views): Mild scoliotic curvature of the lumbar spine.  Disc space narrowing at the L3-L4, L4-L5, and L5-S1 levels with anterior osteophyte formation.  L5-S1 facet arthropathy.  No acute bony abnormality is appreciated.   I personally reviewed and interpreted the radiographs.   Assessment:   63 y.o. female presenting today with chronic low back pain as well as left-sided sciatica.  She experienced very similar symptoms 1 year ago which improved from an oral steroid and physical therapy.  X-rays taken today do show some mild to moderate degenerative findings although no significant changes from last year.  Will plan to have her finish off current Medrol  Dosepak and will refer back to physical therapy as this has been beneficial previously.  Should symptoms continue to recur or persist despite treatments, would likely consider further workup with an MRI to determine if she would be a good candidate for ESI.  Plan :    - Referral to physical therapy for low back pain and  left-sided sciatica - Return to clinic within 6 weeks should symptoms persist or worsen     I personally saw and evaluated the patient, and participated in the management and treatment plan.  Leonce Reveal, PA-C Orthopedics  This document was dictated using Conservation officer, historic buildings. A reasonable attempt at proof reading has been made to minimize errors.

## 2024-06-16 ENCOUNTER — Other Ambulatory Visit (HOSPITAL_COMMUNITY): Payer: Self-pay

## 2024-06-18 ENCOUNTER — Ambulatory Visit (HOSPITAL_BASED_OUTPATIENT_CLINIC_OR_DEPARTMENT_OTHER): Attending: Student | Admitting: Physical Therapy

## 2024-06-18 ENCOUNTER — Other Ambulatory Visit: Payer: Self-pay

## 2024-06-18 ENCOUNTER — Encounter (HOSPITAL_BASED_OUTPATIENT_CLINIC_OR_DEPARTMENT_OTHER): Payer: Self-pay | Admitting: Physical Therapy

## 2024-06-18 DIAGNOSIS — M5416 Radiculopathy, lumbar region: Secondary | ICD-10-CM | POA: Insufficient documentation

## 2024-06-18 DIAGNOSIS — M5459 Other low back pain: Secondary | ICD-10-CM | POA: Insufficient documentation

## 2024-06-18 DIAGNOSIS — G8929 Other chronic pain: Secondary | ICD-10-CM | POA: Diagnosis not present

## 2024-06-18 DIAGNOSIS — M5442 Lumbago with sciatica, left side: Secondary | ICD-10-CM | POA: Insufficient documentation

## 2024-06-18 NOTE — Therapy (Signed)
 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   Hypothyroidism    Seasonal allergies    Wears glasses    Past Surgical History:  Procedure Laterality Date   BREAST BIOPSY Right 01/2015   BREAST EXCISIONAL BIOPSY Right 03/29/2015   BREAST LUMPECTOMY WITH RADIOACTIVE SEED LOCALIZATION Right 03/29/2015   Procedure: RIGHT BREAST LUMPECTOMY WITH RADIOACTIVE SEED LOCALIZATION;  Surgeon: Donnice Bury, MD;  Location: [facility];  Service: General;  Laterality: Right;   COLONOSCOPY     ORIF WRIST FRACTURE  12/24/2010   right   REPAIR PERONEAL TENDONS ANKLE  12/24/2010   TOTAL THYROIDECTOMY  12/24/2006   WISDOM TOOTH EXTRACTION     Patient Active Problem List   Diagnosis Date Noted   Allergic rhinitis 05/05/2021   Anxiety disorder 05/05/2021   Chronic pain 05/05/2021   Asthma 05/05/2021   Depressive disorder 05/05/2021   Dupuytren contracture 05/05/2021   Eczema 05/05/2021   Generalized osteoarthritis 05/05/2021   Hypervitaminosis D 05/05/2021   Hypothyroidism 05/05/2021   Insomnia 05/05/2021   Menopausal symptom 05/05/2021   Primary osteoarthritis, unspecified hand 05/05/2021   Recurrent major depression in remission (HCC) 05/05/2021   Sleep disorder 05/05/2021   Dupuytren's contracture of right hand 08/12/2017   Knee pain 08/25/2013   History of colonic polyps 08/24/2013   Metatarsalgia of left foot  01/09/2012   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  ONSET DATE: Sciatica for 3 weeks  SUBJECTIVE:                                                                                                                                                                                           SUBJECTIVE STATEMENT:  Patient has a long history of low back pain.  She reports it began increasing in the spring.  She believes an increase secondary to an increase in doing yard work and gardening.  Approximately 3 weeks ago she began having onset of radicular pain.  The pain follows her posterior thigh and then goes down her lateral leg into her ankle.  The pain comes and goes.  She has a significant amount of pain when she lies in bed.  She is having difficulty finding a comfortable solution to sleep.  She has had physical therapy in the past with benefit.  PERTINENT HISTORY:  Exercise induced asthma,   PAIN:  Are you having pain? Yes: NPRS scale: When pain is bad he can reach 6-7 out of 10 Pain location: Left lumbar spine into left lower extremity Pain description: Aching Aggravating factors: Lying in bed, bending, extending. Relieving factors: Mild improvement in pain with steroid  PRECAUTIONS: None  RED FLAGS: None   WEIGHT BEARING RESTRICTIONS: No  FALLS:  Has patient fallen in last 6 months? No  LIVING ENVIRONMENT: Has a second floor. A couple of steps into the house OCCUPATION:    PLOF: Independent  PATIENT GOALS:  To have less pain in her lumbar spine and into her leg  NEXT MD VISIT: Nothing at this time  OBJECTIVE:  Note: Objective measures were completed at Evaluation unless otherwise noted.  DIAGNOSTIC  FINDINGS:  X-ray reading not back  PATIENT SURVEYS:  Modified Oswestry 18/50 COGNITION: Overall cognitive status: Within functional limits for tasks assessed     SENSATION: Rating pain following a potential L5-S1 dermatome   POSTURE: No significant postural limitations  PALPATION: Significant spasming in left gluteal and left side lumbar spine  LUMBAR ROM:   AROM eval  Flexion Painful catching at end range  Extension Painful past neutral  Right lateral flexion No pain  Left lateral flexion Painful  Right rotation No pain  Left rotation Painful on the left side   (Blank rows = not tested)  LOWER EXTREMITY ROM:     Passive  Right eval Left eval  Hip flexion WNL WNL  Hip extension    Hip abduction    Hip adduction    Hip internal rotation WNL WNL  Hip external rotation WNL WNL  Knee flexion    Knee extension    Ankle dorsiflexion    Ankle plantarflexion    Ankle inversion    Ankle eversion     (Blank rows = not tested)  LOWER EXTREMITY MMT:    MMT Right eval Left eval  Hip flexion 24.7 16.6  Hip extension    Hip abduction 28.7 26.3  Hip adduction    Hip internal rotation    Hip external rotation    Knee flexion    Knee extension 25.9 33.7  Ankle dorsiflexion    Ankle plantarflexion    Ankle inversion    Ankle eversion     (Blank rows = not tested)    GAIT: No significant gait deviations noted TREATMENT DATE: 6/26  Manual: Skilled palpation of trigger points Trigger point release to bilateral lumbar spine Review of self trigger point release  TherEX: Gluteal stretch 3 x 20-second hold Bridge 2 x 10 Supine march 2 x 10 with education on progression Base pool exercises: Hip 3 way Low back stretch  Noodle push down and shoulder extension  Trigger Point Dry Needling  Initial Treatment: Pt instructed on Dry Needling  rational, procedures, and possible side effects. Pt instructed to expect mild to moderate muscle soreness later in the day and/or into the next day.  Pt instructed in methods to reduce muscle soreness. Pt instructed to continue prescribed HEP. Because Dry Needling was performed over or adjacent to a lung field, pt was educated on S/S of pneumothorax and to seek immediate medical attention should they occur.  Patient was educated on signs and symptoms of infection and other risk factors and advised to seek medical attention should they occur.  Patient verbalized understanding of these instructions and education.   Patient Verbal Consent Given: Yes Education Handout Provided: Previously Provided Muscles Treated: left lower lumbar paraspinal and left gluteal using a .30x50 needle  Electrical Stimulation Performed: No Treatment Response/Outcome: great twitch       PATIENT EDUCATION:  Education details: HEP, symptom management, centralization of symptoms, benefits and risks of trigger point dry needling. Person educated: Patient Education method: Explanation, Demonstration, Tactile cues, Verbal cues, and Handouts Education comprehension: verbalized understanding, returned demonstration, verbal cues required, tactile cues required, and needs further education  HOME EXERCISE PROGRAM: [email or web address]   ASSESSMENT:  CLINICAL IMPRESSION: Patient is a 63 year old female with a several month history of low back pain that is turned into an acute exacerbation of left-sided radiculopathy.  She has increased pain with extension and endrange flexion of her lumbar spine.  She has significant spasming in her left-sided lumbar paraspinals and left gluteal.  She has had benefit from trigger point dry needling in the past.  Therapy performed trigger point dry needling to these areas today.  She had this significant twitch response.  We reviewed self soft tissue mobilization for home.  We also reviewed stretches and  exercises from her old program to start with at this time.  She would benefit from skilled therapy to improve ability to sleep at night and reduce radicular symptoms into her left lower extremity OBJECTIVE IMPAIRMENTS: Abnormal gait, decreased activity tolerance, difficulty walking, decreased ROM, decreased strength, increased muscle spasms, and pain.   ACTIVITY LIMITATIONS: carrying, lifting, standing, squatting, sleeping, and locomotion level  PARTICIPATION LIMITATIONS: community activity and yard work  PERSONAL FACTORS: none   REHAB POTENTIAL: good   CLINICAL DECISION MAKING: Stable/uncomplicated  EVALUATION COMPLEXITY: Low   GOALS: Goals reviewed with patient? Yes  SHORT TERM GOALS: Target date: 07/16/2024    Patient will extend past neutral and rotated to the left without increased pain. Baseline: Goal status: INITIAL  2.  Patient will report centralization of symptoms into her lower back Baseline:  Goal status: INITIAL  3.  Patient will increase left lower extremity hip flexion by 5 pounds Baseline:  Goal status: INITIAL  4.  Patient will be independent with basic HEP Baseline:  Goal status: INITIAL  08/13/2024   LONG TERM GOALS: Target date: 08/13/2024    Patient will sleep through the night without pain  Baseline:  Goal status: INITIAL  2.  Patient will perform gardening activity without increased pain  Baseline:  Goal status: INITIAL  3.  Patient will demonstrate full ROM of the lumbar spine  without increased pain  Baseline:  Goal status: INITIAL   PLAN:  PT FREQUENCY: 2x/week  PT DURATION: 8 weeks  PLANNED INTERVENTIONS: 97110-Therapeutic exercises, 97530- Therapeutic activity, [id number]- Neuromuscular re-education, 97535- Self Care, [id number]- Manual therapy, [id number]- Gait training, [phone]- Aquatic Therapy, [id number]- Electrical stimulation (unattended), [phone]- Ultrasound, Patient/Family education, Stair training, Taping, Dry Needling, DME instructions,  Cryotherapy, and Moist heat .  PLAN FOR NEXT SESSION:  Continue with TPDN and manual therapy. Consider LAD with oscillations. Update and progress exercise program. Consider pool session. Therapy reviewed base    Alm JINNY Don, PT 06/18/2024, 8:05 PM

## 2024-06-18 NOTE — Patient Instructions (Signed)

## 2024-06-22 ENCOUNTER — Ambulatory Visit (HOSPITAL_BASED_OUTPATIENT_CLINIC_OR_DEPARTMENT_OTHER): Admitting: Physical Therapy

## 2024-06-22 ENCOUNTER — Encounter (HOSPITAL_BASED_OUTPATIENT_CLINIC_OR_DEPARTMENT_OTHER): Payer: Self-pay

## 2024-06-23 ENCOUNTER — Other Ambulatory Visit: Payer: Self-pay

## 2024-06-23 ENCOUNTER — Other Ambulatory Visit (HOSPITAL_COMMUNITY): Payer: Self-pay

## 2024-06-23 ENCOUNTER — Other Ambulatory Visit (HOSPITAL_BASED_OUTPATIENT_CLINIC_OR_DEPARTMENT_OTHER): Payer: Self-pay

## 2024-06-23 MED ORDER — BISACODYL 5 MG PO TBEC
DELAYED_RELEASE_TABLET | ORAL | 0 refills | Status: AC
  Filled 2024-06-23: qty 4, 1d supply, fill #0

## 2024-06-23 MED ORDER — PEG 3350-KCL-NA BICARB-NACL 420 G PO SOLR
ORAL | 0 refills | Status: AC
  Filled 2024-06-23: qty 4000, 1d supply, fill #0

## 2024-06-24 DIAGNOSIS — G4733 Obstructive sleep apnea (adult) (pediatric): Secondary | ICD-10-CM | POA: Diagnosis not present

## 2024-06-25 ENCOUNTER — Other Ambulatory Visit: Payer: Self-pay

## 2024-06-29 ENCOUNTER — Encounter (HOSPITAL_BASED_OUTPATIENT_CLINIC_OR_DEPARTMENT_OTHER): Payer: Self-pay | Admitting: Physical Therapy

## 2024-06-29 ENCOUNTER — Ambulatory Visit (HOSPITAL_BASED_OUTPATIENT_CLINIC_OR_DEPARTMENT_OTHER): Attending: Student | Admitting: Physical Therapy

## 2024-06-29 DIAGNOSIS — M5459 Other low back pain: Secondary | ICD-10-CM | POA: Insufficient documentation

## 2024-06-29 DIAGNOSIS — M5416 Radiculopathy, lumbar region: Secondary | ICD-10-CM | POA: Insufficient documentation

## 2024-06-29 NOTE — Therapy (Signed)
 OUTPATIENT PHYSICAL THERAPY THORACOLUMBAR EVALUATION   Patient Name: [name] MRN: [id number] DOB:[birth year], 63 y.o., female Today's Date: 06/29/2024  END OF SESSION:  PT End of Session - 06/29/24 1612     Visit Number 2    Number of Visits 16    Date for PT Re-Evaluation 08/14/24    PT Start Time 1532    PT Stop Time 1612    PT Time Calculation (min) 40 min    Activity Tolerance Patient tolerated treatment well    Behavior During Therapy [facility] for tasks assessed/performed           Past Medical History:  Diagnosis Date   Exercise-induced asthma    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   Hypothyroidism    Seasonal allergies    Wears glasses    Past Surgical History:  Procedure Laterality Date   BREAST BIOPSY Right 01/2015   BREAST EXCISIONAL BIOPSY Right 03/29/2015   BREAST LUMPECTOMY WITH RADIOACTIVE SEED LOCALIZATION Right 03/29/2015   Procedure: RIGHT BREAST LUMPECTOMY WITH RADIOACTIVE SEED LOCALIZATION;  Surgeon: Donnice Bury, MD;  Location: [facility];  Service: General;  Laterality: Right;   COLONOSCOPY     ORIF WRIST FRACTURE  12/24/2010   right   REPAIR PERONEAL TENDONS ANKLE  12/24/2010   TOTAL THYROIDECTOMY  12/24/2006   WISDOM TOOTH EXTRACTION     Patient Active Problem List   Diagnosis Date Noted   Allergic rhinitis 05/05/2021   Anxiety disorder 05/05/2021   Chronic pain 05/05/2021   Asthma 05/05/2021   Depressive disorder 05/05/2021   Dupuytren contracture 05/05/2021   Eczema 05/05/2021   Generalized osteoarthritis 05/05/2021   Hypervitaminosis D 05/05/2021   Hypothyroidism 05/05/2021   Insomnia 05/05/2021   Menopausal symptom 05/05/2021   Primary osteoarthritis, unspecified hand 05/05/2021   Recurrent major depression in remission (HCC) 05/05/2021   Sleep disorder 05/05/2021   Dupuytren's contracture of right hand 08/12/2017   Knee pain 08/25/2013   History of colonic polyps 08/24/2013   Metatarsalgia of left foot  01/09/2012   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/22/2010    PCP: Sari Cable, MD  REFERRING PROVIDER: Leonce Reveal, PA  REFERRING DIAG:  Diagnosis  M54.42,M54.41 (ICD-10-CM) - Acute left-sided low back pain with bilateral sciatica    Rationale for Evaluation and Treatment: Rehabilitation  THERAPY DIAG:  Radiculopathy, lumbar region  Other low back pain  ONSET DATE: Sciatica for 3 weeks  SUBJECTIVE:  SUBJECTIVE STATEMENT:  Pt states the back is much less painful compared to last time. The NT  comes and goes. Pt has more radiating pain down to the ankle. Sore for about 24-48 hours.   Eval: Patient has a long history of low back pain.  She reports it began increasing in the spring.  She believes an increase secondary to an increase in doing yard work and gardening.  Approximately 3 weeks ago she began having onset of radicular pain.  The pain follows her posterior thigh and then goes down her lateral leg into her ankle.  The pain comes and goes.  She has a significant amount of pain when she lies in bed.  She is having difficulty finding a comfortable solution to sleep.  She has had physical therapy in the past with benefit.  PERTINENT HISTORY:  Exercise induced asthma,   PAIN:  Are you having pain? Yes: NPRS scale: 2/10 Pain location: Left lumbar spine into left lower extremity Pain description: Aching Aggravating factors: Lying in bed, bending, extending. Relieving factors: Mild improvement in pain with steroid  PRECAUTIONS: None  RED FLAGS: None   WEIGHT BEARING RESTRICTIONS: No  FALLS:  Has patient fallen in last 6 months? No  LIVING ENVIRONMENT: Has a second floor. A couple of steps into the house OCCUPATION: cancer center   PLOF: Independent  PATIENT GOALS:  To have less pain in her  lumbar spine and into her leg  NEXT MD VISIT: Nothing at this time  OBJECTIVE:  Note: Objective measures were completed at Evaluation unless otherwise noted.  DIAGNOSTIC FINDINGS:  X-ray reading not back  PATIENT SURVEYS:  Modified Oswestry 18/50 COGNITION: Overall cognitive status: Within functional limits for tasks assessed     SENSATION: Rating pain following a potential L5-S1 dermatome   POSTURE: No significant postural limitations  PALPATION: Significant spasming in left gluteal and left side lumbar spine  LUMBAR ROM:   AROM eval  Flexion Painful catching at end range  Extension Painful past neutral  Right lateral flexion No pain  Left lateral flexion Painful  Right rotation No pain  Left rotation Painful on the left side   (Blank rows = not tested)  LOWER EXTREMITY ROM:     Passive  Right eval Left eval  Hip flexion WNL WNL  Hip extension    Hip abduction    Hip adduction    Hip internal rotation WNL WNL  Hip external rotation WNL WNL  Knee flexion    Knee extension    Ankle dorsiflexion    Ankle plantarflexion    Ankle inversion    Ankle eversion     (Blank rows = not tested)  LOWER EXTREMITY MMT:    MMT Right eval Left eval  Hip flexion 24.7 16.6  Hip extension    Hip abduction 28.7 26.3  Hip adduction    Hip internal rotation    Hip external rotation    Knee flexion    Knee extension 25.9 33.7  Ankle dorsiflexion    Ankle plantarflexion    Ankle inversion    Ankle eversion     (Blank rows = not tested)    GAIT: No significant gait deviations noted TREATMENT DATE:   7/7  STM bilat QL and lumbar paraspinals L L1-5 UPA grade III LAD with PPT 2x12  Exercises - Supine Sciatic Nerve Glide  - 1 x daily - 7 x weekly - 2 sets - 10 reps - 2 hold - Bridge with green  Hip Abduction and Resistance  - 1 x daily - 7 x weekly - 2 sets - 10 reps - DNS Bug Bicycle  - 1 x daily - 7 x weekly - 2 sets - 20 reps - Single-Leg Anti-Rotation  Press With  yellow Anchored Resistance  - 1 x daily - 7 x weekly - 2 sets - 10 reps   6/26                                                                                                                              Manual: Skilled palpation of trigger points Trigger point release to bilateral lumbar spine Review of self trigger point release  TherEX: Gluteal stretch 3 x 20-second hold Bridge 2 x 10 Supine march 2 x 10 with education on progression Base pool exercises: Hip 3 way Low back stretch  Noodle push down and shoulder extension  Trigger Point Dry Needling  Initial Treatment: Pt instructed on Dry Needling rational, procedures, and possible side effects. Pt instructed to expect mild to moderate muscle soreness later in the day and/or into the next day.  Pt instructed in methods to reduce muscle soreness. Pt instructed to continue prescribed HEP. Because Dry Needling was performed over or adjacent to a lung field, pt was educated on S/S of pneumothorax and to seek immediate medical attention should they occur.  Patient was educated on signs and symptoms of infection and other risk factors and advised to seek medical attention should they occur.  Patient verbalized understanding of these instructions and education.   Patient Verbal Consent Given: Yes Education Handout Provided: Previously Provided Muscles Treated: left lower lumbar paraspinal and left gluteal using a .30x50 needle  Electrical Stimulation Performed: No Treatment Response/Outcome: great twitch       PATIENT EDUCATION:  Education details: HEP, symptom management, centralization of symptoms, benefits and risks of trigger point dry needling. Person educated: Patient Education method: Explanation, Demonstration, Tactile cues, Verbal cues, and Handouts Education comprehension: verbalized understanding, returned demonstration, verbal cues required, tactile cues required, and needs further education  HOME  EXERCISE PROGRAM: [email or web address]   ASSESSMENT:  CLINICAL IMPRESSION: Pt able to continue with session today without TPDN. Manual therapy, particularly LAD with pelvic rotation reduced report of lateral ankle aching from beginning of session. Pt HEP consolidated and supine exercises progressed. Pt without pain during session. Painful burning with STM to glute. Exercise focused on desensitization and bilat LE stability. Plan to use TPDN PRN for pain management and continue with progressive lumbopelvic stability as tolerated. Consider a jefferson curl, bird dog, and/or weighted SB at next.  She would benefit from skilled therapy to improve ability to sleep at night and reduce radicular symptoms into her left lower extremity OBJECTIVE IMPAIRMENTS: Abnormal gait, decreased activity tolerance, difficulty walking, decreased ROM, decreased strength, increased muscle spasms, and pain.   ACTIVITY LIMITATIONS: carrying, lifting, standing, squatting, sleeping, and locomotion level  PARTICIPATION LIMITATIONS: community activity and yard work  PERSONAL FACTORS: none   REHAB POTENTIAL: good   CLINICAL DECISION MAKING: Stable/uncomplicated  EVALUATION COMPLEXITY: Low   GOALS: Goals reviewed with patient? Yes  SHORT TERM GOALS: Target date: 07/16/2024    Patient will extend past neutral and rotated to the left without increased pain. Baseline: Goal status: INITIAL  2.  Patient will report centralization of symptoms into her lower back Baseline:  Goal status: INITIAL  3.  Patient will increase left lower extremity hip flexion by 5 pounds Baseline:  Goal status: INITIAL  4.  Patient will be independent with basic HEP Baseline:  Goal status: INITIAL  08/13/2024   LONG TERM GOALS: Target date: 08/13/2024    Patient will sleep through the night without pain  Baseline:  Goal status: INITIAL  2.  Patient will perform gardening activity without increased pain  Baseline:  Goal status:  INITIAL  3.  Patient will demonstrate full ROM of the lumbar spine without increased pain  Baseline:  Goal status: INITIAL   PLAN:  PT FREQUENCY: 2x/week  PT DURATION: 8 weeks  PLANNED INTERVENTIONS: 97110-Therapeutic exercises, 97530- Therapeutic activity, [id number]- Neuromuscular re-education, 97535- Self Care, [id number]- Manual therapy, [id number]- Gait training, [phone]- Aquatic Therapy, 97014- Electrical stimulation (unattended), [phone]- Ultrasound, Patient/Family education, Stair training, Taping, Dry Needling, DME instructions, Cryotherapy, and Moist heat .  PLAN FOR NEXT SESSION:  Continue with TPDN and manual therapy. Consider LAD with oscillations. Update and progress exercise program. Consider pool session. Therapy reviewed base    Dale Call, PT 06/29/2024, 4:15 PM

## 2024-06-30 ENCOUNTER — Other Ambulatory Visit (HOSPITAL_COMMUNITY): Payer: Self-pay

## 2024-06-30 ENCOUNTER — Other Ambulatory Visit: Payer: Self-pay

## 2024-07-02 ENCOUNTER — Other Ambulatory Visit: Payer: Self-pay

## 2024-07-08 ENCOUNTER — Ambulatory Visit (HOSPITAL_BASED_OUTPATIENT_CLINIC_OR_DEPARTMENT_OTHER): Admitting: Physical Therapy

## 2024-07-08 ENCOUNTER — Encounter (HOSPITAL_BASED_OUTPATIENT_CLINIC_OR_DEPARTMENT_OTHER): Payer: Self-pay | Admitting: Physical Therapy

## 2024-07-08 DIAGNOSIS — M5416 Radiculopathy, lumbar region: Secondary | ICD-10-CM

## 2024-07-08 DIAGNOSIS — M5459 Other low back pain: Secondary | ICD-10-CM

## 2024-07-08 NOTE — Therapy (Signed)
 OUTPATIENT PHYSICAL THERAPY THORACOLUMBAR EVALUATION   Patient Name: [name] MRN: [id number] DOB:11-Jun-1961, 63 y.o., female Today's Date: 07/08/2024  END OF SESSION:  PT End of Session - 07/08/24 1144     Visit Number 3    Number of Visits 16    Date for PT Re-Evaluation 08/14/24    PT Start Time 1101    PT Stop Time 1140    PT Time Calculation (min) 39 min    Activity Tolerance Patient tolerated treatment well    Behavior During Therapy [facility] for tasks assessed/performed            Past Medical History:  Diagnosis Date   Exercise-induced asthma    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   Hypothyroidism    Seasonal allergies    Wears glasses    Past Surgical History:  Procedure Laterality Date   BREAST BIOPSY Right 01/2015   BREAST EXCISIONAL BIOPSY Right 03/29/2015   BREAST LUMPECTOMY WITH RADIOACTIVE SEED LOCALIZATION Right 03/29/2015   Procedure: RIGHT BREAST LUMPECTOMY WITH RADIOACTIVE SEED LOCALIZATION;  Surgeon: Donnice Bury, MD;  Location: [facility];  Service: General;  Laterality: Right;   COLONOSCOPY     ORIF WRIST FRACTURE  12/24/2010   right   REPAIR PERONEAL TENDONS ANKLE  12/24/2010   TOTAL THYROIDECTOMY  12/24/2006   WISDOM TOOTH EXTRACTION     Patient Active Problem List   Diagnosis Date Noted   Allergic rhinitis 05/05/2021   Anxiety disorder 05/05/2021   Chronic pain 05/05/2021   Asthma 05/05/2021   Depressive disorder 05/05/2021   Dupuytren contracture 05/05/2021   Eczema 05/05/2021   Generalized osteoarthritis 05/05/2021   Hypervitaminosis D 05/05/2021   Hypothyroidism 05/05/2021   Insomnia 05/05/2021   Menopausal symptom 05/05/2021   Primary osteoarthritis, unspecified hand 05/05/2021   Recurrent major depression in remission (HCC) 05/05/2021   Sleep disorder 05/05/2021   Dupuytren's contracture of right hand 08/12/2017   Knee pain 08/25/2013   History of colonic polyps 08/24/2013   Metatarsalgia of left  foot 01/09/2012   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 She denies worsening of NT. L is ankle and R is in the upper thigh.   Eval: Patient has a long history of low back pain.  She reports it began increasing in the spring.  She believes an increase secondary to an increase in doing yard work and gardening.  Approximately 3 weeks ago she began having onset of radicular pain.  The pain follows her posterior thigh and then goes down her lateral leg into her ankle.  The pain comes and goes.  She has a significant amount of pain when she lies in bed.  She is having difficulty finding a comfortable solution to sleep.  She has had physical therapy in the past with benefit.  PERTINENT HISTORY:  Exercise induced asthma,   PAIN:  Are you having pain? Yes: NPRS scale: 2/10 Pain location: Left lumbar spine into left lower extremity Pain description: Aching Aggravating factors: Lying in bed, bending, extending. Relieving factors: Mild improvement in pain with steroid  PRECAUTIONS: None  RED FLAGS: None   WEIGHT BEARING RESTRICTIONS: No  FALLS:  Has patient fallen in last 6 months? No  LIVING ENVIRONMENT: Has a second floor. A couple of steps into the house OCCUPATION: cancer center   PLOF: Independent  PATIENT GOALS:  To have less pain in her lumbar spine and into her leg  NEXT  MD VISIT: Nothing at this time  OBJECTIVE:  Note: Objective measures were completed at Evaluation unless otherwise noted.  DIAGNOSTIC FINDINGS:  X-ray reading not back  PATIENT SURVEYS:  Modified Oswestry 18/50 COGNITION: Overall cognitive status: Within functional limits for tasks assessed     SENSATION: Rating pain following a potential L5-S1 dermatome   POSTURE: No significant postural limitations  PALPATION: Significant spasming in left gluteal and left side lumbar spine  LUMBAR ROM:   AROM eval  Flexion Painful catching at end range  Extension Painful past neutral  Right lateral flexion No pain  Left lateral flexion Painful  Right rotation No pain  Left rotation Painful on the left side   (Blank rows = not tested)  LOWER EXTREMITY ROM:     Passive  Right eval Left eval  Hip flexion WNL WNL  Hip extension    Hip abduction    Hip adduction    Hip internal rotation WNL WNL  Hip external rotation WNL WNL  Knee flexion    Knee extension    Ankle dorsiflexion    Ankle plantarflexion    Ankle inversion    Ankle eversion     (Blank rows = not tested)  LOWER EXTREMITY MMT:    MMT Right eval Left eval  Hip flexion 24.7 16.6  Hip extension    Hip abduction 28.7 26.3  Hip adduction    Hip internal rotation    Hip external rotation    Knee flexion    Knee extension 25.9 33.7  Ankle dorsiflexion    Ankle plantarflexion    Ankle inversion    Ankle eversion     (Blank rows = not tested)    GAIT: No significant gait deviations noted TREATMENT DATE:   7/16   STM bilat QL and lumbar paraspinals L  and R L1-5 UPA grade III  Cobra with hands out 2x10 Weighted SB 2x10  Postural modifications throughout work day  7/7  STM bilat QL and lumbar paraspinals L L1-5 UPA grade III LAD with PPT 2x12  Exercises - Supine  Sciatic Nerve Glide  - 1 x daily - 7 x weekly - 2 sets - 10 reps - 2 hold - Bridge with green  Hip Abduction and Resistance  - 1 x  daily - 7 x weekly - 2 sets - 10 reps - DNS Bug Bicycle  - 1 x daily - 7 x weekly - 2 sets - 20 reps - Single-Leg Anti-Rotation Press With  yellow Anchored Resistance  - 1 x daily - 7 x weekly - 2 sets - 10 reps   6/26                                                                                                                              Manual: Skilled palpation of trigger points Trigger point release to bilateral lumbar spine Review of self trigger point release  TherEX: Gluteal stretch 3 x 20-second hold Bridge 2 x 10 Supine march 2 x 10 with education on progression Base pool exercises: Hip 3 way Low back stretch  Noodle push down and shoulder extension  Trigger Point Dry Needling  Initial Treatment: Pt instructed on Dry Needling rational, procedures, and possible side effects. Pt instructed to expect mild to moderate muscle soreness later in the day and/or into the next day.  Pt instructed in methods to reduce muscle soreness. Pt instructed to continue prescribed HEP. Because Dry Needling was performed over or adjacent to a lung field, pt was educated on S/S of pneumothorax and to seek immediate medical attention should they occur.  Patient was educated on signs and symptoms of infection and other risk factors and advised to seek medical attention should they occur.  Patient verbalized understanding of these instructions and education.   Patient Verbal Consent Given: Yes Education Handout Provided: Previously Provided Muscles Treated: left lower lumbar paraspinal and left gluteal using a .30x50 needle  Electrical Stimulation Performed: No Treatment Response/Outcome: great twitch       PATIENT EDUCATION:  Education details: HEP, symptom management, centralization of symptoms, benefits and risks of trigger point dry needling. Person educated: Patient Education method: Explanation, Demonstration, Tactile cues, Verbal cues, and Handouts Education comprehension:  verbalized understanding, returned demonstration, verbal cues required, tactile cues required, and needs further education  HOME EXERCISE PROGRAM: [email or web address]   ASSESSMENT:  CLINICAL IMPRESSION: Given pt increase in bilat radicular symptoms, progressive flexion and weighted exercise held. Pt did respond well again to manual therapy with reduction of reported stiffness. Pt was able to tolerate extension based movements with good tolerance and no recreation of radicular symptoms. HEP updated with SB and extension movements. Consider a bird dog, and/or weighted carry/walking for isometric strength and tissue tolerance.  She would benefit from skilled therapy to improve ability to sleep at night and reduce radicular symptoms into her left lower extremity OBJECTIVE IMPAIRMENTS: Abnormal gait, decreased activity tolerance, difficulty walking, decreased ROM, decreased strength, increased muscle spasms, and pain.   ACTIVITY LIMITATIONS: carrying, lifting, standing, squatting, sleeping, and locomotion  level  PARTICIPATION LIMITATIONS: community activity and yard work  PERSONAL FACTORS: none   REHAB POTENTIAL: good   CLINICAL DECISION MAKING: Stable/uncomplicated  EVALUATION COMPLEXITY: Low   GOALS: Goals reviewed with patient? Yes  SHORT TERM GOALS: Target date: 07/16/2024    Patient will extend past neutral and rotated to the left without increased pain. Baseline: Goal status: INITIAL  2.  Patient will report centralization of symptoms into her lower back Baseline:  Goal status: INITIAL  3.  Patient will increase left lower extremity hip flexion by 5 pounds Baseline:  Goal status: INITIAL  4.  Patient will be independent with basic HEP Baseline:  Goal status: INITIAL  08/13/2024   LONG TERM GOALS: Target date: 08/13/2024    Patient will sleep through the night without pain  Baseline:  Goal status: INITIAL  2.  Patient will perform gardening activity without increased pain   Baseline:  Goal status: INITIAL  3.  Patient will demonstrate full ROM of the lumbar spine without increased pain  Baseline:  Goal status: INITIAL   PLAN:  PT FREQUENCY: 2x/week  PT DURATION: 8 weeks  PLANNED INTERVENTIONS: 97110-Therapeutic exercises, 97530- Therapeutic activity, [id number]- Neuromuscular re-education, 97535- Self Care, 02859- Manual therapy, [id number]- Gait training, [phone]- Aquatic Therapy, 97014- Electrical stimulation (unattended), [phone]- Ultrasound, Patient/Family education, Stair training, Taping, Dry Needling, DME instructions, Cryotherapy, and Moist heat .  PLAN FOR NEXT SESSION:  Continue with TPDN and manual therapy. Consider LAD with oscillations. Update and progress exercise program. Consider pool session. Therapy reviewed base    Dale Call, PT 07/08/2024, 12:00 PM

## 2024-07-10 DIAGNOSIS — Z09 Encounter for follow-up examination after completed treatment for conditions other than malignant neoplasm: Secondary | ICD-10-CM | POA: Diagnosis not present

## 2024-07-10 DIAGNOSIS — K648 Other hemorrhoids: Secondary | ICD-10-CM | POA: Diagnosis not present

## 2024-07-10 DIAGNOSIS — Z860101 Personal history of adenomatous and serrated colon polyps: Secondary | ICD-10-CM | POA: Diagnosis not present

## 2024-07-14 ENCOUNTER — Other Ambulatory Visit (HOSPITAL_COMMUNITY): Payer: Self-pay

## 2024-07-14 ENCOUNTER — Other Ambulatory Visit: Payer: Self-pay

## 2024-07-15 ENCOUNTER — Other Ambulatory Visit: Payer: Self-pay

## 2024-07-16 ENCOUNTER — Ambulatory Visit (HOSPITAL_BASED_OUTPATIENT_CLINIC_OR_DEPARTMENT_OTHER): Payer: Self-pay | Admitting: Physical Therapy

## 2024-07-16 ENCOUNTER — Other Ambulatory Visit (HOSPITAL_COMMUNITY): Payer: Self-pay

## 2024-07-16 ENCOUNTER — Other Ambulatory Visit: Payer: Self-pay

## 2024-07-16 ENCOUNTER — Encounter: Payer: Self-pay | Admitting: Pharmacist

## 2024-07-16 DIAGNOSIS — M5416 Radiculopathy, lumbar region: Secondary | ICD-10-CM

## 2024-07-16 DIAGNOSIS — M5459 Other low back pain: Secondary | ICD-10-CM

## 2024-07-16 NOTE — Therapy (Signed)
 OUTPATIENT PHYSICAL THERAPY THORACOLUMBAR EVALUATION   Patient Name: [name] MRN: [id number] DOB:08-11-1961, 63 y.o., female Today's Date: 07/17/2024  END OF SESSION:  PT End of Session - 07/17/24 1306     Visit Number 4    Number of Visits 16    Date for PT Re-Evaluation 08/14/24    PT Start Time 1315    PT Stop Time 1358    PT Time Calculation (min) 43 min    Activity Tolerance Patient tolerated treatment well    Behavior During Therapy [facility] for tasks assessed/performed             Past Medical History:  Diagnosis Date   Exercise-induced asthma    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   Hypothyroidism    Seasonal allergies    Wears glasses    Past Surgical History:  Procedure Laterality Date   BREAST BIOPSY Right 01/2015   BREAST EXCISIONAL BIOPSY Right 03/29/2015   BREAST LUMPECTOMY WITH RADIOACTIVE SEED LOCALIZATION Right 03/29/2015   Procedure: RIGHT BREAST LUMPECTOMY WITH RADIOACTIVE SEED LOCALIZATION;  Surgeon: Donnice Bury, MD;  Location: [facility];  Service: General;  Laterality: Right;   COLONOSCOPY     ORIF WRIST FRACTURE  12/24/2010   right   REPAIR PERONEAL TENDONS ANKLE  12/24/2010   TOTAL THYROIDECTOMY  12/24/2006   WISDOM TOOTH EXTRACTION     Patient Active Problem List   Diagnosis Date Noted   Allergic rhinitis 05/05/2021   Anxiety disorder 05/05/2021   Chronic pain 05/05/2021   Asthma 05/05/2021   Depressive disorder 05/05/2021   Dupuytren contracture 05/05/2021   Eczema 05/05/2021   Generalized osteoarthritis 05/05/2021   Hypervitaminosis D 05/05/2021   Hypothyroidism 05/05/2021   Insomnia 05/05/2021   Menopausal symptom 05/05/2021   Primary osteoarthritis, unspecified hand 05/05/2021   Recurrent major depression in remission (HCC) 05/05/2021   Sleep disorder 05/05/2021   Dupuytren's contracture of right hand 08/12/2017   Knee pain 08/25/2013   History of colonic polyps 08/24/2013   Metatarsalgia of left  foot 01/09/2012   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 She denies worsening of NT. L is ankle and R is in the upper thigh.   Eval: Patient has a long history of low back pain.  She reports it began increasing in the spring.  She believes an increase secondary to an increase in doing yard work and gardening.  Approximately 3 weeks ago she began having onset of radicular pain.  The pain follows her posterior thigh and then goes down her lateral leg into her ankle.  The pain comes and goes.  She has a significant amount of pain when she lies in bed.  She is having difficulty finding a comfortable solution to sleep.  She has had physical therapy in the past with benefit.  PERTINENT HISTORY:  Exercise induced asthma,   PAIN:  Are you having pain? Yes: NPRS scale: 2/10 Pain location: Left lumbar spine into left lower extremity Pain description: Aching Aggravating factors: Lying in bed, bending, extending. Relieving factors: Mild improvement in pain with steroid  PRECAUTIONS: None  RED FLAGS: None   WEIGHT BEARING RESTRICTIONS: No  FALLS:  Has patient fallen in last 6 months? No  LIVING ENVIRONMENT: Has a second floor. A couple of steps into the house OCCUPATION: cancer center   PLOF: Independent  PATIENT GOALS:  To have less pain in her lumbar spine and into her leg  NEXT  MD VISIT: Nothing at this time  OBJECTIVE:  Note: Objective measures were completed at Evaluation unless otherwise noted.  DIAGNOSTIC FINDINGS:  X-ray reading not back  PATIENT SURVEYS:  Modified Oswestry 18/50 COGNITION: Overall cognitive status: Within functional limits for tasks assessed     SENSATION: Rating pain following a potential L5-S1 dermatome   POSTURE: No significant postural limitations  PALPATION: Significant spasming in left gluteal and left side lumbar spine  LUMBAR ROM:   AROM eval  Flexion Painful catching at end range  Extension Painful past neutral  Right lateral flexion No pain  Left lateral flexion Painful  Right rotation No pain  Left rotation Painful on the left side   (Blank rows = not tested)  LOWER EXTREMITY ROM:     Passive  Right eval Left eval  Hip flexion WNL WNL  Hip extension    Hip abduction    Hip adduction    Hip internal rotation WNL WNL  Hip external rotation WNL WNL  Knee flexion    Knee extension    Ankle dorsiflexion    Ankle plantarflexion    Ankle inversion    Ankle eversion     (Blank rows = not tested)  LOWER EXTREMITY MMT:    MMT Right eval Left eval  Hip flexion 24.7 16.6  Hip extension    Hip abduction 28.7 26.3  Hip adduction    Hip internal rotation    Hip external rotation    Knee flexion    Knee extension 25.9 33.7  Ankle dorsiflexion    Ankle plantarflexion    Ankle inversion    Ankle eversion     (Blank rows = not tested)    GAIT: No significant gait deviations noted TREATMENT DATE:   7/25 Manual:  Trigger Point Dry Needling  Initial Treatment: Pt instructed on Dry Needling rational, procedures, and possible side effects. Pt instructed to expect mild to moderate muscle soreness later in the day and/or into the next day.  Pt instructed in methods to reduce muscle soreness. Pt instructed to continue prescribed HEP.  Because Dry Needling was performed over or adjacent to a lung  field, pt was educated on S/S of pneumothorax and to seek immediate medical attention should they occur.  Patient was educated on signs and symptoms of infection and other risk factors and advised to seek medical attention should they occur.  Patient verbalized understanding of these instructions and education.   Patient Verbal Consent Given: Yes Education Handout Provided: Previously Provided Muscles Treated: left lower lumbar paraspinal and left gluteal using a .30x50 needle 3x in paraspinals 2x in gluteal  Electrical Stimulation Performed: No Treatment Response/Outcome: great twitch   Manual: skilled palpation of trigger points Review of trigger points with patient for self mobilization  Grade II and III LAD with oscillations   There-ex:  Reviewed and updated HEP  Gluteal stretch 3x20 sec hold   Neuro-re-ed:  Row red with TA breathing 3x10  Shoulder extension red with TA breathing 3x10     7/16   STM bilat QL and lumbar paraspinals L  and R L1-5 UPA grade III  Cobra with hands out 2x10 Weighted SB 2x10  Postural modifications throughout work day  7/7  STM bilat QL and lumbar paraspinals L L1-5 UPA grade III LAD with PPT 2x12  Exercises - Supine Sciatic Nerve Glide  - 1 x daily - 7 x weekly - 2 sets - 10 reps - 2 hold - Bridge with green  Hip Abduction and Resistance  - 1 x daily - 7 x weekly - 2 sets - 10 reps - DNS Bug Bicycle  - 1 x daily - 7 x weekly - 2 sets - 20 reps - Single-Leg Anti-Rotation Press With  yellow Anchored Resistance  - 1 x daily - 7 x weekly - 2 sets - 10 reps   6/26                                                                                                                              Manual: Skilled palpation of trigger points Trigger point release to bilateral lumbar spine Review of self trigger point release  TherEX: Gluteal stretch 3 x 20-second hold Bridge 2 x 10 Supine march 2 x 10 with education on progression Base pool  exercises: Hip 3 way Low back stretch  Noodle push down and shoulder extension  Trigger Point Dry Needling  Initial Treatment: Pt instructed on Dry Needling rational, procedures, and possible side effects. Pt instructed to expect mild to moderate muscle soreness later in the day and/or into the next day.  Pt instructed in methods to reduce muscle soreness. Pt instructed to continue prescribed HEP. Because Dry Needling was performed over or adjacent to a lung field, pt was educated on S/S of pneumothorax and to seek immediate medical attention should they occur.  Patient was educated on signs and symptoms of infection and other risk factors and advised to seek medical attention should they occur.  Patient verbalized understanding of these instructions and  education.   Patient Verbal Consent Given: Yes Education Handout Provided: Previously Provided Muscles Treated: left lower lumbar paraspinal and left gluteal using a .30x50 needle  Electrical Stimulation Performed: No Treatment Response/Outcome: great twitch       PATIENT EDUCATION:  Education details: HEP, symptom management, centralization of symptoms, benefits and risks of trigger point dry needling. Person educated: Patient Education method: Explanation, Demonstration, Tactile cues, Verbal cues, and Handouts Education comprehension: verbalized understanding, returned demonstration, verbal cues required, tactile cues required, and needs further education  HOME EXERCISE PROGRAM: [email or web address]   ASSESSMENT:  CLINICAL IMPRESSION: The patient had reported the extension and bending at home increased symptoms. These were removed from her program. We reviewed stretching and self soft tissue mobilization. We reviewed UE core stability exercises to give her variety in her core exercises. Therapy will continue to progress as tolerated.   OBJECTIVE IMPAIRMENTS: Abnormal gait, decreased activity tolerance, difficulty walking, decreased ROM,  decreased strength, increased muscle spasms, and pain.   ACTIVITY LIMITATIONS: carrying, lifting, standing, squatting, sleeping, and locomotion level  PARTICIPATION LIMITATIONS: community activity and yard work  PERSONAL FACTORS: none   REHAB POTENTIAL: good   CLINICAL DECISION MAKING: Stable/uncomplicated  EVALUATION COMPLEXITY: Low   GOALS: Goals reviewed with patient? Yes  SHORT TERM GOALS: Target date: 07/16/2024    Patient will extend past neutral and rotated to the left without increased pain. Baseline: Goal status:  increased pain today 7/25  2.  Patient will report centralization of symptoms into her lower back Baseline:  Goal status: had centralized but now is back   3.  Patient will increase left lower extremity hip flexion by 5 pounds Baseline:  Goal status: INITIAL  4.  Patient will be independent with basic HEP Baseline:  Goal status: INITIAL  08/13/2024   LONG TERM GOALS: Target date: 08/13/2024    Patient will sleep through the night without pain  Baseline:  Goal status: INITIAL  2.  Patient will perform gardening activity without increased pain  Baseline:  Goal status: INITIAL  3.  Patient will demonstrate full ROM of the lumbar spine without increased pain  Baseline:  Goal status: INITIAL   PLAN:  PT FREQUENCY: 2x/week  PT DURATION: 8 weeks  PLANNED INTERVENTIONS: 97110-Therapeutic exercises, 97530- Therapeutic activity, [id number]- Neuromuscular re-education, 97535- Self Care, [id number]- Manual therapy, [id number]- Gait training, [phone]- Aquatic Therapy, 97014- Electrical stimulation (unattended), [phone]- Ultrasound, Patient/Family education, Stair training, Taping, Dry Needling, DME instructions, Cryotherapy, and Moist heat .  PLAN FOR NEXT SESSION:  Continue with TPDN and manual therapy. Consider LAD with oscillations. Update and progress exercise program. Consider pool session. Therapy reviewed base    Alm JINNY Don, PT 07/17/2024, 1:10 PM

## 2024-07-17 ENCOUNTER — Other Ambulatory Visit: Payer: Self-pay

## 2024-07-17 ENCOUNTER — Encounter (HOSPITAL_BASED_OUTPATIENT_CLINIC_OR_DEPARTMENT_OTHER): Payer: Self-pay | Admitting: Physical Therapy

## 2024-07-21 ENCOUNTER — Other Ambulatory Visit: Payer: Self-pay

## 2024-07-22 ENCOUNTER — Ambulatory Visit (HOSPITAL_BASED_OUTPATIENT_CLINIC_OR_DEPARTMENT_OTHER): Admitting: Physical Therapy

## 2024-07-22 ENCOUNTER — Encounter (HOSPITAL_BASED_OUTPATIENT_CLINIC_OR_DEPARTMENT_OTHER): Payer: Self-pay | Admitting: Physical Therapy

## 2024-07-22 DIAGNOSIS — M5459 Other low back pain: Secondary | ICD-10-CM

## 2024-07-22 DIAGNOSIS — M5416 Radiculopathy, lumbar region: Secondary | ICD-10-CM | POA: Diagnosis not present

## 2024-07-22 NOTE — Therapy (Signed)
 OUTPATIENT PHYSICAL THERAPY THORACOLUMBAR EVALUATION   Patient Name: [name] MRN: [id number] DOB:02-18-1961, 63 y.o., female Today's Date: 07/22/2024  END OF SESSION:  PT End of Session - 07/22/24 1333     Visit Number 5    Number of Visits 16    Date for PT Re-Evaluation 08/14/24    PT Start Time 1300    PT Stop Time 1345    PT Time Calculation (min) 45 min    Activity Tolerance Patient tolerated treatment well    Behavior During Therapy [facility] for tasks assessed/performed             Past Medical History:  Diagnosis Date   Exercise-induced asthma    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   Hypothyroidism    Seasonal allergies    Wears glasses    Past Surgical History:  Procedure Laterality Date   BREAST BIOPSY Right 01/2015   BREAST EXCISIONAL BIOPSY Right 03/29/2015   BREAST LUMPECTOMY WITH RADIOACTIVE SEED LOCALIZATION Right 03/29/2015   Procedure: RIGHT BREAST LUMPECTOMY WITH RADIOACTIVE SEED LOCALIZATION;  Surgeon: Donnice Bury, MD;  Location: [facility];  Service: General;  Laterality: Right;   COLONOSCOPY     ORIF WRIST FRACTURE  12/24/2010   right   REPAIR PERONEAL TENDONS ANKLE  12/24/2010   TOTAL THYROIDECTOMY  12/24/2006   WISDOM TOOTH EXTRACTION     Patient Active Problem List   Diagnosis Date Noted   Allergic rhinitis 05/05/2021   Anxiety disorder 05/05/2021   Chronic pain 05/05/2021   Asthma 05/05/2021   Depressive disorder 05/05/2021   Dupuytren contracture 05/05/2021   Eczema 05/05/2021   Generalized osteoarthritis 05/05/2021   Hypervitaminosis D 05/05/2021   Hypothyroidism 05/05/2021   Insomnia 05/05/2021   Menopausal symptom 05/05/2021   Primary osteoarthritis, unspecified hand 05/05/2021   Recurrent major depression in remission (HCC) 05/05/2021   Sleep disorder 05/05/2021   Dupuytren's contracture of right hand 08/12/2017   Knee pain 08/25/2013   History of colonic polyps 08/24/2013   Metatarsalgia of left  foot 01/09/2012   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  ONSET DATE: Sciatica for 3 weeks  SUBJECTIVE:  SUBJECTIVE STATEMENT:  Pt reports that the back was sore after last session. She denies worsening of NT. L is ankle and R is in the upper thigh.   Eval: Patient has a long history of low back pain.  She reports it began increasing in the spring.  She believes an increase secondary to an increase in doing yard work and gardening.  Approximately 3 weeks ago she began having onset of radicular pain.  The pain follows her posterior thigh and then goes down her lateral leg into her ankle.  The pain comes and goes.  She has a significant amount of pain when she lies in bed.  She is having difficulty finding a comfortable solution to sleep.  She has had physical therapy in the past with benefit.  PERTINENT HISTORY:  Exercise induced asthma,   PAIN:  Are you having pain? Yes: NPRS scale: 2/10 Pain location: Left lumbar spine into left lower extremity Pain description: Aching Aggravating factors: Lying in bed, bending, extending. Relieving factors: Mild improvement in pain with steroid  PRECAUTIONS: None  RED FLAGS: None   WEIGHT BEARING RESTRICTIONS: No  FALLS:  Has patient fallen in last 6 months? No  LIVING ENVIRONMENT: Has a second floor. A couple of steps into the house OCCUPATION: cancer center   PLOF: Independent  PATIENT GOALS:  To have less pain in her lumbar spine and into her leg  NEXT MD VISIT: Nothing at this  time  OBJECTIVE:  Note: Objective measures were completed at Evaluation unless otherwise noted.  DIAGNOSTIC FINDINGS:  X-ray reading not back  PATIENT SURVEYS:  Modified Oswestry 18/50 COGNITION: Overall cognitive status: Within functional limits for tasks assessed     SENSATION: Rating pain following a potential L5-S1 dermatome   POSTURE: No significant postural limitations  PALPATION: Significant spasming in left gluteal and left side lumbar spine  LUMBAR ROM:   AROM eval  Flexion Painful catching at end range  Extension Painful past neutral  Right lateral flexion No pain  Left lateral flexion Painful  Right rotation No pain  Left rotation Painful on the left side   (Blank rows = not tested)  LOWER EXTREMITY ROM:     Passive  Right eval Left eval  Hip flexion WNL WNL  Hip extension    Hip abduction    Hip adduction    Hip internal rotation WNL WNL  Hip external rotation WNL WNL  Knee flexion    Knee extension    Ankle dorsiflexion    Ankle plantarflexion    Ankle inversion    Ankle eversion     (Blank rows = not tested)  LOWER EXTREMITY MMT:    MMT Right eval Left eval  Hip flexion 24.7 16.6  Hip extension    Hip abduction 28.7 26.3  Hip adduction    Hip internal rotation    Hip external rotation    Knee flexion    Knee extension 25.9 33.7  Ankle dorsiflexion    Ankle plantarflexion    Ankle inversion    Ankle eversion     (Blank rows = not tested)    GAIT: No significant gait deviations noted TREATMENT DATE:  7/30 Trigger Point Dry Needling  Initial Treatment: Pt instructed on Dry Needling rational, procedures, and possible side effects. Pt instructed to expect mild to moderate muscle soreness later in the day and/or into the next day.  Pt instructed in methods to reduce muscle soreness. Pt instructed to continue prescribed HEP. Because Dry Needling  was performed over or adjacent to a lung field, pt was educated on S/S of  pneumothorax and to seek immediate medical attention should they occur.  Patient was educated on signs and symptoms of infection and other risk factors and advised to seek medical attention should they occur.  Patient verbalized understanding of these instructions and education.   Patient Verbal Consent Given: Yes Education Handout Provided: Previously Provided Muscles Treated: left lower lumbar paraspinal and left gluteal using a .30x50 needle 3x in paraspinals 2x in gluteal  Electrical Stimulation Performed: No Treatment Response/Outcome: great twitch   Manual: skilled palpation of trigger points Review of trigger points with patient for self mobilization  Grade II and III LAD with oscillations   Neuro-re-ed:  March with switch with abdominal brace 2x10 Bridge 2x12   Cable:  Row 15 pounds 3 x 12 with abdominal breathing Shoulder extension 3 x 12 with abdominal breathing  Reviewed HEP   7/25 Manual:  Trigger Point Dry Needling  Initial Treatment: Pt instructed on Dry Needling rational, procedures, and possible side effects. Pt instructed to expect mild to moderate muscle soreness later in the day and/or into the next day.  Pt instructed in methods to reduce muscle soreness. Pt instructed to continue prescribed HEP. Because Dry Needling was performed over or adjacent to a lung field, pt was educated on S/S of pneumothorax and to seek immediate medical attention should they occur.  Patient was educated on signs and symptoms of infection and other risk factors and advised to seek medical attention should they occur.  Patient verbalized understanding of these instructions and education.   Patient Verbal Consent Given: Yes Education Handout Provided: Previously Provided Muscles Treated: left lower lumbar paraspinal and left gluteal using a .30x50 needle 3x in paraspinals 2x in gluteal  Electrical Stimulation Performed: No Treatment Response/Outcome: great twitch   Manual:  skilled palpation of trigger points Review of trigger points with patient for self mobilization  Grade II and III LAD with oscillations   There-ex:  Reviewed and updated HEP  Gluteal stretch 3x20 sec hold   Neuro-re-ed:  Row red with TA breathing 3x10  Shoulder extension red with TA breathing 3x10     7/16   STM bilat QL and lumbar paraspinals L  and R L1-5 UPA grade III  Cobra with hands out 2x10 Weighted SB 2x10  Postural modifications throughout work day  7/7  STM bilat QL and lumbar paraspinals L L1-5 UPA grade III LAD with PPT 2x12  Exercises - Supine Sciatic Nerve Glide  - 1 x daily - 7 x weekly - 2 sets - 10 reps - 2 hold - Bridge with green  Hip Abduction and Resistance  - 1 x daily - 7 x weekly - 2 sets - 10 reps - DNS Bug Bicycle  - 1 x daily - 7 x weekly - 2 sets - 20 reps - Single-Leg Anti-Rotation Press With  yellow Anchored Resistance  - 1 x daily - 7 x weekly - 2 sets - 10 reps   6/26  Manual: Skilled palpation of trigger points Trigger point release to bilateral lumbar spine Review of self trigger point release  TherEX: Gluteal stretch 3 x 20-second hold Bridge 2 x 10 Supine march 2 x 10 with education on progression Base pool exercises: Hip 3 way Low back stretch  Noodle push down and shoulder extension  Trigger Point Dry Needling  Initial Treatment: Pt instructed on Dry Needling rational, procedures, and possible side effects. Pt instructed to expect mild to moderate muscle soreness later in the day and/or into the next day.  Pt instructed in methods to reduce muscle soreness. Pt instructed to continue prescribed HEP. Because Dry Needling was performed over or adjacent to a lung field, pt was educated on S/S of pneumothorax and to seek immediate medical attention should they occur.  Patient was educated on signs and  symptoms of infection and other risk factors and advised to seek medical attention should they occur.  Patient verbalized understanding of these instructions and education.   Patient Verbal Consent Given: Yes Education Handout Provided: Previously Provided Muscles Treated: left lower lumbar paraspinal and left gluteal using a .30x50 needle  Electrical Stimulation Performed: No Treatment Response/Outcome: great twitch       PATIENT EDUCATION:  Education details: HEP, symptom management, centralization of symptoms, benefits and risks of trigger point dry needling. Person educated: Patient Education method: Explanation, Demonstration, Tactile cues, Verbal cues, and Handouts Education comprehension: verbalized understanding, returned demonstration, verbal cues required, tactile cues required, and needs further education  HOME EXERCISE PROGRAM: [email or web address]   ASSESSMENT:  CLINICAL IMPRESSION: Patient reports improvement.  She had less trigger points and spasming compared to last visit.  We reviewed her HEP.  She is advised to keep her exercises neutral.  She will continue to work on her stretching and tennis ball at home.  Therapy performed trigger point dry needling to the lumbar spine and gluteal.  She continues to have a great twitch response.  Therapy will continue to progress strengthening as tolerated. OBJECTIVE IMPAIRMENTS: Abnormal gait, decreased activity tolerance, difficulty walking, decreased ROM, decreased strength, increased muscle spasms, and pain.   ACTIVITY LIMITATIONS: carrying, lifting, standing, squatting, sleeping, and locomotion level  PARTICIPATION LIMITATIONS: community activity and yard work  PERSONAL FACTORS: none   REHAB POTENTIAL: good   CLINICAL DECISION MAKING: Stable/uncomplicated  EVALUATION COMPLEXITY: Low   GOALS: Goals reviewed with patient? Yes  SHORT TERM GOALS: Target date: 07/16/2024    Patient will extend past neutral and rotated to the  left without increased pain. Baseline: Goal status:  increased pain today 7/25  2.  Patient will report centralization of symptoms into her lower back Baseline:  Goal status: had centralized but now is back   3.  Patient will increase left lower extremity hip flexion by 5 pounds Baseline:  Goal status: INITIAL  4.  Patient will be independent with basic HEP Baseline:  Goal status: INITIAL  08/13/2024   LONG TERM GOALS: Target date: 08/13/2024    Patient will sleep through the night without pain  Baseline:  Goal status: INITIAL  2.  Patient will perform gardening activity without increased pain  Baseline:  Goal status: INITIAL  3.  Patient will demonstrate full ROM of the lumbar spine without increased pain  Baseline:  Goal status: INITIAL   PLAN:  PT FREQUENCY: 2x/week  PT DURATION: 8 weeks  PLANNED INTERVENTIONS: 97110-Therapeutic exercises, 97530- Therapeutic activity, [id number]- Neuromuscular re-education, 97535- Self Care, 02859- Manual therapy, [id number]- Gait training, [phone]- Aquatic Therapy, 97014- Electrical stimulation (  unattended), [phone]- Ultrasound, Patient/Family education, Stair training, Taping, Dry Needling, DME instructions, Cryotherapy, and Moist heat .  PLAN FOR NEXT SESSION:  Continue with TPDN and manual therapy. Consider LAD with oscillations. Update and progress exercise program. Consider pool session. Therapy reviewed base    Alm JINNY Don, PT 07/22/2024, 1:34 PM

## 2024-07-25 DIAGNOSIS — G4733 Obstructive sleep apnea (adult) (pediatric): Secondary | ICD-10-CM | POA: Diagnosis not present

## 2024-07-29 ENCOUNTER — Other Ambulatory Visit (HOSPITAL_COMMUNITY): Payer: Self-pay

## 2024-07-29 ENCOUNTER — Encounter (HOSPITAL_BASED_OUTPATIENT_CLINIC_OR_DEPARTMENT_OTHER): Payer: Self-pay | Admitting: Physical Therapy

## 2024-07-29 ENCOUNTER — Other Ambulatory Visit (HOSPITAL_BASED_OUTPATIENT_CLINIC_OR_DEPARTMENT_OTHER): Payer: Self-pay

## 2024-07-29 ENCOUNTER — Ambulatory Visit (HOSPITAL_BASED_OUTPATIENT_CLINIC_OR_DEPARTMENT_OTHER): Attending: Student | Admitting: Physical Therapy

## 2024-07-29 DIAGNOSIS — Z6826 Body mass index (BMI) 26.0-26.9, adult: Secondary | ICD-10-CM | POA: Diagnosis not present

## 2024-07-29 DIAGNOSIS — M5416 Radiculopathy, lumbar region: Secondary | ICD-10-CM | POA: Diagnosis not present

## 2024-07-29 DIAGNOSIS — G4733 Obstructive sleep apnea (adult) (pediatric): Secondary | ICD-10-CM | POA: Diagnosis not present

## 2024-07-29 DIAGNOSIS — M5459 Other low back pain: Secondary | ICD-10-CM | POA: Diagnosis not present

## 2024-07-29 DIAGNOSIS — E663 Overweight: Secondary | ICD-10-CM | POA: Diagnosis not present

## 2024-07-29 DIAGNOSIS — Z8639 Personal history of other endocrine, nutritional and metabolic disease: Secondary | ICD-10-CM | POA: Diagnosis not present

## 2024-07-29 MED ORDER — PHENTERMINE HCL 37.5 MG PO TABS
18.7500 mg | ORAL_TABLET | Freq: Every day | ORAL | 3 refills | Status: AC
  Filled 2024-07-29 – 2024-08-03 (×2): qty 30, 30d supply, fill #0
  Filled 2024-10-23 – 2024-10-28 (×2): qty 30, 30d supply, fill #1
  Filled 2024-11-15: qty 30, 30d supply, fill #2
  Filled 2024-11-16: qty 30, 30d supply, fill #1
  Filled 2024-11-16 – 2025-01-20 (×2): qty 30, 30d supply, fill #2

## 2024-07-29 MED ORDER — ZEPBOUND 15 MG/0.5ML ~~LOC~~ SOAJ
15.0000 mg | SUBCUTANEOUS | 3 refills | Status: AC
  Filled 2024-07-29 – 2024-08-03 (×2): qty 2, 28d supply, fill #0
  Filled 2024-09-26: qty 2, 28d supply, fill #1

## 2024-07-29 NOTE — Therapy (Signed)
 OUTPATIENT PHYSICAL THERAPY THORACOLUMBAR EVALUATION   Patient Name: [name] MRN: [id number] DOB:1961-05-03, 63 y.o., female Today's Date: 07/29/2024  END OF SESSION:  PT End of Session - 07/29/24 0929     Visit Number 6    Number of Visits 16    Date for PT Re-Evaluation 08/14/24    PT Start Time 0930    PT Stop Time 1011    PT Time Calculation (min) 41 min    Activity Tolerance Patient tolerated treatment well    Behavior During Therapy [facility] for tasks assessed/performed             Past Medical History:  Diagnosis Date   Exercise-induced asthma    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   Hypothyroidism    Seasonal allergies    Wears glasses    Past Surgical History:  Procedure Laterality Date   BREAST BIOPSY Right 01/2015   BREAST EXCISIONAL BIOPSY Right 03/29/2015   BREAST LUMPECTOMY WITH RADIOACTIVE SEED LOCALIZATION Right 03/29/2015   Procedure: RIGHT BREAST LUMPECTOMY WITH RADIOACTIVE SEED LOCALIZATION;  Surgeon: Donnice Bury, MD;  Location: [facility];  Service: General;  Laterality: Right;   COLONOSCOPY     ORIF WRIST FRACTURE  12/24/2010   right   REPAIR PERONEAL TENDONS ANKLE  12/24/2010   TOTAL THYROIDECTOMY  12/24/2006   WISDOM TOOTH EXTRACTION     Patient Active Problem List   Diagnosis Date Noted   Allergic rhinitis 05/05/2021   Anxiety disorder 05/05/2021   Chronic pain 05/05/2021   Asthma 05/05/2021   Depressive disorder 05/05/2021   Dupuytren contracture 05/05/2021   Eczema 05/05/2021   Generalized osteoarthritis 05/05/2021   Hypervitaminosis D 05/05/2021   Hypothyroidism 05/05/2021   Insomnia 05/05/2021   Menopausal symptom 05/05/2021   Primary osteoarthritis, unspecified hand 05/05/2021   Recurrent major depression in remission (HCC) 05/05/2021   Sleep disorder 05/05/2021   Dupuytren's contracture of right hand 08/12/2017   Knee pain 08/25/2013   History of colonic polyps 08/24/2013   Metatarsalgia of left  foot 01/09/2012   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/22/2010    PCP: Sari Cable, MD  REFERRING PROVIDER: Leonce Reveal, PA  REFERRING DIAG:  Diagnosis  M54.42,M54.41 (ICD-10-CM) - Acute left-sided low back pain with bilateral sciatica    Rationale for Evaluation and Treatment: Rehabilitation  THERAPY DIAG:  Radiculopathy, lumbar region  Other low back pain  ONSET DATE: Sciatica for 3 weeks  SUBJECTIVE:  SUBJECTIVE STATEMENT:  Pt has much less tingling than previous sessions. Pt has very little to no pain right now.   Eval: Patient has a long history of low back pain.  She reports it began increasing in the spring.  She believes an increase secondary to an increase in doing yard work and gardening.  Approximately 3 weeks ago she began having onset of radicular pain.  The pain follows her posterior thigh and then goes down her lateral leg into her ankle.  The pain comes and goes.  She has a significant amount of pain when she lies in bed.  She is having difficulty finding a comfortable solution to sleep.  She has had physical therapy in the past with benefit.  PERTINENT HISTORY:  Exercise induced asthma,   PAIN:  Are you having pain? Yes: NPRS scale: 0/10 Pain location: Left lumbar spine into left lower extremity Pain description: Aching Aggravating factors: Lying in bed, bending, extending. Relieving factors: Mild improvement in pain with steroid PRECAUTIONS: None  RED FLAGS: None   WEIGHT BEARING RESTRICTIONS: No  FALLS:  Has patient fallen in last 6 months? No  LIVING ENVIRONMENT: Has a second floor. A couple of steps into the house OCCUPATION: cancer center   PLOF: Independent  PATIENT GOALS:  To have less pain in her lumbar spine and into her leg  NEXT MD VISIT: Nothing at this  time  OBJECTIVE:  Note: Objective measures were completed at Evaluation unless otherwise noted.  DIAGNOSTIC FINDINGS:  X-ray reading not back  PATIENT SURVEYS:  Modified Oswestry 18/50 COGNITION: Overall cognitive status: Within functional limits for tasks assessed     SENSATION: Rating pain following a potential L5-S1 dermatome   POSTURE: No significant postural limitations  PALPATION: Significant spasming in left gluteal and left side lumbar spine  LUMBAR ROM:   AROM eval  Flexion Painful catching at end range  Extension Painful past neutral  Right lateral flexion No pain  Left lateral flexion Painful  Right rotation No pain  Left rotation Painful on the left side   (Blank rows = not tested)  LOWER EXTREMITY ROM:     Passive  Right eval Left eval  Hip flexion WNL WNL  Hip extension    Hip abduction    Hip adduction    Hip internal rotation WNL WNL  Hip external rotation WNL WNL  Knee flexion    Knee extension    Ankle dorsiflexion    Ankle plantarflexion    Ankle inversion    Ankle eversion     (Blank rows = not tested)  LOWER EXTREMITY MMT:    MMT Right eval Left eval  Hip flexion 24.7 16.6  Hip extension    Hip abduction 28.7 26.3  Hip adduction    Hip internal rotation    Hip external rotation    Knee flexion    Knee extension 25.9 33.7  Ankle dorsiflexion    Ankle plantarflexion    Ankle inversion    Ankle eversion     (Blank rows = not tested)    GAIT: No significant gait deviations noted TREATMENT DATE:  8/6   STM bilat QL and lumbar paraspinals; manual desensitization of R glute  Bridge with blue TB 3s 2x10 Curl up hooklying 2s 4x6; with blue TB isometric hold Kneeling plank 5s 10x Side plank 5x each side Standing SLS with hip ABD blue band 10x ea     7/30 Trigger Point Dry Needling  Initial Treatment: Pt instructed on  Dry Needling rational, procedures, and possible side effects. Pt instructed to expect mild to  moderate muscle soreness later in the day and/or into the next day.  Pt instructed in methods to reduce muscle soreness. Pt instructed to continue prescribed HEP. Because Dry Needling was performed over or adjacent to a lung field, pt was educated on S/S of pneumothorax and to seek immediate medical attention should they occur.  Patient was educated on signs and symptoms of infection and other risk factors and advised to seek medical attention should they occur.  Patient verbalized understanding of these instructions and education.   Patient Verbal Consent Given: Yes Education Handout Provided: Previously Provided Muscles Treated: left lower lumbar paraspinal and left gluteal using a .30x50 needle 3x in paraspinals 2x in gluteal  Electrical Stimulation Performed: No Treatment Response/Outcome: great twitch   Manual: skilled palpation of trigger points Review of trigger points with patient for self mobilization  Grade II and III LAD with oscillations   Neuro-re-ed:  March with switch with abdominal brace 2x10 Bridge 2x12   Cable:  Row 15 pounds 3 x 12 with abdominal breathing Shoulder extension 3 x 12 with abdominal breathing  Reviewed HEP   7/25 Manual:  Trigger Point Dry Needling  Initial Treatment: Pt instructed on Dry Needling rational, procedures, and possible side effects. Pt instructed to expect mild to moderate muscle soreness later in the day and/or into the next day.  Pt instructed in methods to reduce muscle soreness. Pt instructed to continue prescribed HEP. Because Dry Needling was performed over or adjacent to a lung field, pt was educated on S/S of pneumothorax and to seek immediate medical attention should they occur.  Patient was educated on signs and symptoms of infection and other risk factors and advised to seek medical attention should they occur.  Patient verbalized understanding of these instructions and education.   Patient Verbal Consent Given:  Yes Education Handout Provided: Previously Provided Muscles Treated: left lower lumbar paraspinal and left gluteal using a .30x50 needle 3x in paraspinals 2x in gluteal  Electrical Stimulation Performed: No Treatment Response/Outcome: great twitch   Manual: skilled palpation of trigger points Review of trigger points with patient for self mobilization  Grade II and III LAD with oscillations   There-ex:  Reviewed and updated HEP  Gluteal stretch 3x20 sec hold   Neuro-re-ed:  Row red with TA breathing 3x10  Shoulder extension red with TA breathing 3x10     7/16   STM bilat QL and lumbar paraspinals L  and R L1-5 UPA grade III  Cobra with hands out 2x10 Weighted SB 2x10  Postural modifications throughout work day  7/7  STM bilat QL and lumbar paraspinals L L1-5 UPA grade III LAD with PPT 2x12  Exercises - Supine Sciatic Nerve Glide  - 1 x daily - 7 x weekly - 2 sets - 10 reps - 2 hold - Bridge with green  Hip Abduction and Resistance  - 1 x daily - 7 x weekly - 2 sets - 10 reps - DNS Bug Bicycle  - 1 x daily - 7 x weekly - 2 sets - 20 reps - Single-Leg Anti-Rotation Press With  yellow Anchored Resistance  - 1 x daily - 7 x weekly - 2 sets - 10 reps   6/26  Manual: Skilled palpation of trigger points Trigger point release to bilateral lumbar spine Review of self trigger point release  TherEX: Gluteal stretch 3 x 20-second hold Bridge 2 x 10 Supine march 2 x 10 with education on progression Base pool exercises: Hip 3 way Low back stretch  Noodle push down and shoulder extension  Trigger Point Dry Needling  Initial Treatment: Pt instructed on Dry Needling rational, procedures, and possible side effects. Pt instructed to expect mild to moderate muscle soreness later in the day and/or into the next day.  Pt instructed in methods to reduce  muscle soreness. Pt instructed to continue prescribed HEP. Because Dry Needling was performed over or adjacent to a lung field, pt was educated on S/S of pneumothorax and to seek immediate medical attention should they occur.  Patient was educated on signs and symptoms of infection and other risk factors and advised to seek medical attention should they occur.  Patient verbalized understanding of these instructions and education.   Patient Verbal Consent Given: Yes Education Handout Provided: Previously Provided Muscles Treated: left lower lumbar paraspinal and left gluteal using a .30x50 needle  Electrical Stimulation Performed: No Treatment Response/Outcome: great twitch       PATIENT EDUCATION:  Education details: HEP, symptom management, centralization of symptoms, benefits and risks of trigger point dry needling. Person educated: Patient Education method: Explanation, Demonstration, Tactile cues, Verbal cues, and Handouts Education comprehension: verbalized understanding, returned demonstration, verbal cues required, tactile cues required, and needs further education  HOME EXERCISE PROGRAM: [id number]   ASSESSMENT:  CLINICAL IMPRESSION: Patient arrived without pain as compared to previous sessions so dry needling deferred at this time.  Progressive neutral spine position strengthening and desensitization exercise added for home.  Patient had no increase in radicular symptoms or low back pain by end of session.  Consider standing exercises at neck session as tolerated.  Patient does have lateral hip pain with light palpation suggesting continued nerve hypersensitivity.  Consider addition of nerve gliding exercises in future sessions as tolerated.    OBJECTIVE IMPAIRMENTS: Abnormal gait, decreased activity tolerance, difficulty walking, decreased ROM, decreased strength, increased muscle spasms, and pain.   ACTIVITY LIMITATIONS: carrying, lifting, standing, squatting, sleeping, and  locomotion level  PARTICIPATION LIMITATIONS: community activity and yard work  PERSONAL FACTORS: none   REHAB POTENTIAL: good   CLINICAL DECISION MAKING: Stable/uncomplicated  EVALUATION COMPLEXITY: Low   GOALS: Goals reviewed with patient? Yes  SHORT TERM GOALS: Target date: 07/16/2024    Patient will extend past neutral and rotated to the left without increased pain. Baseline: Goal status:  increased pain today 7/25  2.  Patient will report centralization of symptoms into her lower back Baseline:  Goal status: had centralized but now is back   3.  Patient will increase left lower extremity hip flexion by 5 pounds Baseline:  Goal status: INITIAL  4.  Patient will be independent with basic HEP Baseline:  Goal status: INITIAL  08/13/2024   LONG TERM GOALS: Target date: 08/13/2024    Patient will sleep through the night without pain  Baseline:  Goal status: INITIAL  2.  Patient will perform gardening activity without increased pain  Baseline:  Goal status: INITIAL  3.  Patient will demonstrate full ROM of the lumbar spine without increased pain  Baseline:  Goal status: INITIAL   PLAN:  PT FREQUENCY: 2x/week  PT DURATION: 8 weeks  PLANNED INTERVENTIONS: 97110-Therapeutic exercises, 97530- Therapeutic activity, [id number]- Neuromuscular re-education, 97535- Self Care, [id number]- Manual therapy, [id number]-  Gait training, [id number]- Aquatic Therapy, [id number]- Electrical stimulation (unattended), [id number]- Ultrasound, Patient/Family education, Stair training, Taping, Dry Needling, DME instructions, Cryotherapy, and Moist heat .  PLAN FOR NEXT SESSION:  Continue with TPDN and manual therapy. Consider LAD with oscillations. Update and progress exercise program. Consider pool session. Therapy reviewed base    Dale Call, PT 07/29/2024, 10:16 AM

## 2024-07-30 ENCOUNTER — Other Ambulatory Visit: Payer: Self-pay

## 2024-07-30 ENCOUNTER — Encounter: Payer: Self-pay | Admitting: Pharmacist

## 2024-08-03 ENCOUNTER — Other Ambulatory Visit (HOSPITAL_COMMUNITY): Payer: Self-pay

## 2024-08-03 DIAGNOSIS — G4733 Obstructive sleep apnea (adult) (pediatric): Secondary | ICD-10-CM | POA: Diagnosis not present

## 2024-08-04 ENCOUNTER — Other Ambulatory Visit: Payer: Self-pay

## 2024-08-05 ENCOUNTER — Ambulatory Visit (HOSPITAL_BASED_OUTPATIENT_CLINIC_OR_DEPARTMENT_OTHER): Admitting: Physical Therapy

## 2024-08-05 ENCOUNTER — Other Ambulatory Visit (HOSPITAL_COMMUNITY): Payer: Self-pay

## 2024-08-05 ENCOUNTER — Other Ambulatory Visit (HOSPITAL_BASED_OUTPATIENT_CLINIC_OR_DEPARTMENT_OTHER): Payer: Self-pay

## 2024-08-05 ENCOUNTER — Other Ambulatory Visit: Payer: Self-pay

## 2024-08-05 ENCOUNTER — Encounter: Payer: Self-pay | Admitting: Pharmacist

## 2024-08-05 ENCOUNTER — Encounter (HOSPITAL_BASED_OUTPATIENT_CLINIC_OR_DEPARTMENT_OTHER): Payer: Self-pay | Admitting: Physical Therapy

## 2024-08-05 DIAGNOSIS — M5459 Other low back pain: Secondary | ICD-10-CM | POA: Diagnosis not present

## 2024-08-05 DIAGNOSIS — M5416 Radiculopathy, lumbar region: Secondary | ICD-10-CM | POA: Diagnosis not present

## 2024-08-05 NOTE — Therapy (Signed)
 OUTPATIENT PHYSICAL THERAPY THORACOLUMBAR EVALUATION   Patient Name: [name] MRN: [id number] DOB:December 14, 1961, 63 y.o., female Today's Date: 08/05/2024  END OF SESSION:  PT End of Session - 08/05/24 0807     Visit Number 7    Number of Visits 16    Date for PT Re-Evaluation 08/14/24    PT Start Time 0802    PT Stop Time 0845    PT Time Calculation (min) 43 min    Activity Tolerance Patient tolerated treatment well    Behavior During Therapy [facility] for tasks assessed/performed             Past Medical History:  Diagnosis Date   Exercise-induced asthma    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   Hypothyroidism    Seasonal allergies    Wears glasses    Past Surgical History:  Procedure Laterality Date   BREAST BIOPSY Right 01/2015   BREAST EXCISIONAL BIOPSY Right 03/29/2015   BREAST LUMPECTOMY WITH RADIOACTIVE SEED LOCALIZATION Right 03/29/2015   Procedure: RIGHT BREAST LUMPECTOMY WITH RADIOACTIVE SEED LOCALIZATION;  Surgeon: Donnice Bury, MD;  Location: [facility];  Service: General;  Laterality: Right;   COLONOSCOPY     ORIF WRIST FRACTURE  12/24/2010   right   REPAIR PERONEAL TENDONS ANKLE  12/24/2010   TOTAL THYROIDECTOMY  12/24/2006   WISDOM TOOTH EXTRACTION     Patient Active Problem List   Diagnosis Date Noted   Allergic rhinitis 05/05/2021   Anxiety disorder 05/05/2021   Chronic pain 05/05/2021   Asthma 05/05/2021   Depressive disorder 05/05/2021   Dupuytren contracture 05/05/2021   Eczema 05/05/2021   Generalized osteoarthritis 05/05/2021   Hypervitaminosis D 05/05/2021   Hypothyroidism 05/05/2021   Insomnia 05/05/2021   Menopausal symptom 05/05/2021   Primary osteoarthritis, unspecified hand 05/05/2021   Recurrent major depression in remission (HCC) 05/05/2021   Sleep disorder 05/05/2021   Dupuytren's contracture of right hand 08/12/2017   Knee pain 08/25/2013   History of colonic polyps 08/24/2013   Metatarsalgia of left  foot 01/09/2012   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/22/2010    PCP: Sari Cable, MD  REFERRING PROVIDER: Leonce Reveal, PA  REFERRING DIAG:  Diagnosis  M54.42,M54.41 (ICD-10-CM) - Acute left-sided low back pain with bilateral sciatica    Rationale for Evaluation and Treatment: Rehabilitation  THERAPY DIAG:  Radiculopathy, lumbar region  Other low back pain  ONSET DATE: Sciatica for 3 weeks  SUBJECTIVE:  SUBJECTIVE STATEMENT: The patient has just a little pain now. Her tingling is just intermittent.   Eval: Patient has a long history of low back pain.  She reports it began increasing in the spring.  She believes an increase secondary to an increase in doing yard work and gardening.  Approximately 3 weeks ago she began having onset of radicular pain.  The pain follows her posterior thigh and then goes down her lateral leg into her ankle.  The pain comes and goes.  She has a significant amount of pain when she lies in bed.  She is having difficulty finding a comfortable solution to sleep.  She has had physical therapy in the past with benefit.  PERTINENT HISTORY:  Exercise induced asthma,   PAIN:  Are you having pain? Yes: NPRS scale: 0/10 Pain location: Left lumbar spine into left lower extremity Pain description: Aching Aggravating factors: Lying in bed, bending, extending. Relieving factors: Mild improvement in pain with steroid PRECAUTIONS: None  RED FLAGS: None   WEIGHT BEARING RESTRICTIONS: No  FALLS:  Has patient fallen in last 6 months? No  LIVING ENVIRONMENT: Has a second floor. A couple of steps into the house OCCUPATION: cancer center   PLOF: Independent  PATIENT GOALS:  To have less pain in her lumbar spine and into her leg  NEXT MD VISIT: Nothing at this time  OBJECTIVE:   Note: Objective measures were completed at Evaluation unless otherwise noted.  DIAGNOSTIC FINDINGS:  X-ray reading not back  PATIENT SURVEYS:  Modified Oswestry 18/50 COGNITION: Overall cognitive status: Within functional limits for tasks assessed     SENSATION: Rating pain following a potential L5-S1 dermatome   POSTURE: No significant postural limitations  PALPATION: Significant spasming in left gluteal and left side lumbar spine  LUMBAR ROM:   AROM eval  Flexion Painful catching at end range  Extension Painful past neutral  Right lateral flexion No pain  Left lateral flexion Painful  Right rotation No pain  Left rotation Painful on the left side   (Blank rows = not tested)  LOWER EXTREMITY ROM:     Passive  Right eval Left eval  Hip flexion WNL WNL  Hip extension    Hip abduction    Hip adduction    Hip internal rotation WNL WNL  Hip external rotation WNL WNL  Knee flexion    Knee extension    Ankle dorsiflexion    Ankle plantarflexion    Ankle inversion    Ankle eversion     (Blank rows = not tested)  LOWER EXTREMITY MMT:    MMT Right eval Left eval  Hip flexion 24.7 16.6  Hip extension    Hip abduction 28.7 26.3  Hip adduction    Hip internal rotation    Hip external rotation    Knee flexion    Knee extension 25.9 33.7  Ankle dorsiflexion    Ankle plantarflexion    Ankle inversion    Ankle eversion     (Blank rows = not tested)    GAIT: No significant gait deviations noted TREATMENT DATE:  8/13  Manual: skilled palpation of trigger points Review of trigger points with patient for self mobilization  Grade II and III LAD with oscillations   Neuro-re-ed:  March with switch with abdominal brace 2x12 Bridge 3x12  Pallof press 2x12 red  Chop 2x12 red  Each side   8/6   STM bilat QL and lumbar paraspinals; manual desensitization of R glute  Bridge with  blue TB 3s 2x10 Curl up hooklying 2s 4x6; with blue TB isometric  hold Kneeling plank 5s 10x Side plank 5x each side Standing SLS with hip ABD blue band 10x ea     7/30 Trigger Point Dry Needling  Initial Treatment: Pt instructed on Dry Needling rational, procedures, and possible side effects. Pt instructed to expect mild to moderate muscle soreness later in the day and/or into the next day.  Pt instructed in methods to reduce muscle soreness. Pt instructed to continue prescribed HEP. Because Dry Needling was performed over or adjacent to a lung field, pt was educated on S/S of pneumothorax and to seek immediate medical attention should they occur.  Patient was educated on signs and symptoms of infection and other risk factors and advised to seek medical attention should they occur.  Patient verbalized understanding of these instructions and education.   Patient Verbal Consent Given: Yes Education Handout Provided: Previously Provided Muscles Treated: left lower lumbar paraspinal and left gluteal using a .30x50 needle 3x in paraspinals 2x in gluteal  Electrical Stimulation Performed: No Treatment Response/Outcome: great twitch   Manual: skilled palpation of trigger points Review of trigger points with patient for self mobilization  Grade II and III LAD with oscillations   Neuro-re-ed:  March with switch with abdominal brace 2x10 Bridge 2x12   Cable:  Row 15 pounds 3 x 12 with abdominal breathing Shoulder extension 3 x 12 with abdominal breathing  Reviewed HEP   7/25 Manual:  Trigger Point Dry Needling  Initial Treatment: Pt instructed on Dry Needling rational, procedures, and possible side effects. Pt instructed to expect mild to moderate muscle soreness later in the day and/or into the next day.  Pt instructed in methods to reduce muscle soreness. Pt instructed to continue prescribed HEP. Because Dry Needling was performed over or adjacent to a lung field, pt was educated on S/S of pneumothorax and to seek immediate medical  attention should they occur.  Patient was educated on signs and symptoms of infection and other risk factors and advised to seek medical attention should they occur.  Patient verbalized understanding of these instructions and education.   Patient Verbal Consent Given: Yes Education Handout Provided: Previously Provided Muscles Treated: left lower lumbar paraspinal and left gluteal using a .30x50 needle 3x in paraspinals 2x in gluteal  Electrical Stimulation Performed: No Treatment Response/Outcome: great twitch   Manual: skilled palpation of trigger points Review of trigger points with patient for self mobilization  Grade II and III LAD with oscillations   There-ex:  Reviewed and updated HEP  Gluteal stretch 3x20 sec hold   Neuro-re-ed:  Row red with TA breathing 3x10  Shoulder extension red with TA breathing 3x10     7/16   STM bilat QL and lumbar paraspinals L  and R L1-5 UPA grade III  Cobra with hands out 2x10 Weighted SB 2x10  Postural modifications throughout work day  7/7  STM bilat QL and lumbar paraspinals L L1-5 UPA grade III LAD with PPT 2x12  Exercises - Supine Sciatic Nerve Glide  - 1 x daily - 7 x weekly - 2 sets - 10 reps - 2 hold - Bridge with green  Hip Abduction and Resistance  - 1 x daily - 7 x weekly - 2 sets - 10 reps - DNS Bug Bicycle  - 1 x daily - 7 x weekly - 2 sets - 20 reps - Single-Leg Anti-Rotation Press With  yellow Anchored Resistance  - 1 x  daily - 7 x weekly - 2 sets - 10 reps   6/26                                                                                                                              Manual: Skilled palpation of trigger points Trigger point release to bilateral lumbar spine Review of self trigger point release  TherEX: Gluteal stretch 3 x 20-second hold Bridge 2 x 10 Supine march 2 x 10 with education on progression Base pool exercises: Hip 3 way Low back stretch  Noodle push down and shoulder  extension  Trigger Point Dry Needling  Initial Treatment: Pt instructed on Dry Needling rational, procedures, and possible side effects. Pt instructed to expect mild to moderate muscle soreness later in the day and/or into the next day.  Pt instructed in methods to reduce muscle soreness. Pt instructed to continue prescribed HEP. Because Dry Needling was performed over or adjacent to a lung field, pt was educated on S/S of pneumothorax and to seek immediate medical attention should they occur.  Patient was educated on signs and symptoms of infection and other risk factors and advised to seek medical attention should they occur.  Patient verbalized understanding of these instructions and education.   Patient Verbal Consent Given: Yes Education Handout Provided: Previously Provided Muscles Treated: left lower lumbar paraspinal and left gluteal using a .30x50 needle  Electrical Stimulation Performed: No Treatment Response/Outcome: great twitch       PATIENT EDUCATION:  Education details: HEP, symptom management, centralization of symptoms, benefits and risks of trigger point dry needling. Person educated: Patient Education method: Explanation, Demonstration, Tactile cues, Verbal cues, and Handouts Education comprehension: verbalized understanding, returned demonstration, verbal cues required, tactile cues required, and needs further education  HOME EXERCISE PROGRAM: [id number]   ASSESSMENT:  CLINICAL IMPRESSION: Continues to progress well.  She has mild spasming in her back but it has improved.  Therapy added and pal off press and CHOP to her home program.  She was encouraged to continue strengthening program at home.  Therapy will reassess next visit with potential discharge to HEP.  OBJECTIVE IMPAIRMENTS: Abnormal gait, decreased activity tolerance, difficulty walking, decreased ROM, decreased strength, increased muscle spasms, and pain.   ACTIVITY LIMITATIONS: carrying, lifting,  standing, squatting, sleeping, and locomotion level  PARTICIPATION LIMITATIONS: community activity and yard work  PERSONAL FACTORS: none   REHAB POTENTIAL: good   CLINICAL DECISION MAKING: Stable/uncomplicated  EVALUATION COMPLEXITY: Low   GOALS: Goals reviewed with patient? Yes  SHORT TERM GOALS: Target date: 07/16/2024    Patient will extend past neutral and rotated to the left without increased pain. Baseline: Goal status:  increased pain today 7/25  2.  Patient will report centralization of symptoms into her lower back Baseline:  Goal status: had centralized but now is back   3.  Patient will increase left lower extremity hip flexion by 5 pounds Baseline:  Goal status: INITIAL  4.  Patient will be independent with basic HEP Baseline:  Goal status: INITIAL  08/13/2024   LONG TERM GOALS: Target date: 08/13/2024    Patient will sleep through the night without pain  Baseline:  Goal status: INITIAL  2.  Patient will perform gardening activity without increased pain  Baseline:  Goal status: INITIAL  3.  Patient will demonstrate full ROM of the lumbar spine without increased pain  Baseline:  Goal status: INITIAL   PLAN:  PT FREQUENCY: 2x/week  PT DURATION: 8 weeks  PLANNED INTERVENTIONS: 97110-Therapeutic exercises, 97530- Therapeutic activity, [id number]- Neuromuscular re-education, 97535- Self Care, [id number]- Manual therapy, [id number]- Gait training, [phone]- Aquatic Therapy, [id number]- Electrical stimulation (unattended), [phone]- Ultrasound, Patient/Family education, Stair training, Taping, Dry Needling, DME instructions, Cryotherapy, and Moist heat .  PLAN FOR NEXT SESSION:  Continue with TPDN and manual therapy. Consider LAD with oscillations. Update and progress exercise program. Consider pool session. Therapy reviewed base    Alm JINNY Don, PT 08/05/2024, 8:38 AM

## 2024-08-06 ENCOUNTER — Other Ambulatory Visit (HOSPITAL_COMMUNITY): Payer: Self-pay

## 2024-08-10 ENCOUNTER — Ambulatory Visit (HOSPITAL_BASED_OUTPATIENT_CLINIC_OR_DEPARTMENT_OTHER): Admitting: Physical Therapy

## 2024-08-10 ENCOUNTER — Encounter (HOSPITAL_BASED_OUTPATIENT_CLINIC_OR_DEPARTMENT_OTHER): Payer: Self-pay | Admitting: Physical Therapy

## 2024-08-10 DIAGNOSIS — M5459 Other low back pain: Secondary | ICD-10-CM | POA: Diagnosis not present

## 2024-08-10 DIAGNOSIS — M5416 Radiculopathy, lumbar region: Secondary | ICD-10-CM | POA: Diagnosis not present

## 2024-08-10 NOTE — Therapy (Signed)
 OUTPATIENT PHYSICAL THERAPY THORACOLUMBAR EVALUATION   Patient Name: [name] MRN: [id number] DOB:Mar 22, 1961, 63 y.o., female Today's Date: 08/10/2024  END OF SESSION:       Past Medical History:  Diagnosis Date   Exercise-induced asthma    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   Hypothyroidism    Seasonal allergies    Wears glasses    Past Surgical History:  Procedure Laterality Date   BREAST BIOPSY Right 01/2015   BREAST EXCISIONAL BIOPSY Right 03/29/2015   BREAST LUMPECTOMY WITH RADIOACTIVE SEED LOCALIZATION Right 03/29/2015   Procedure: RIGHT BREAST LUMPECTOMY WITH RADIOACTIVE SEED LOCALIZATION;  Surgeon: Donnice Bury, MD;  Location: [facility];  Service: General;  Laterality: Right;   COLONOSCOPY     ORIF WRIST FRACTURE  12/24/2010   right   REPAIR PERONEAL TENDONS ANKLE  12/24/2010   TOTAL THYROIDECTOMY  12/24/2006   WISDOM TOOTH EXTRACTION     Patient Active Problem List   Diagnosis Date Noted   Allergic rhinitis 05/05/2021   Anxiety disorder 05/05/2021   Chronic pain 05/05/2021   Asthma 05/05/2021   Depressive disorder 05/05/2021   Dupuytren contracture 05/05/2021   Eczema 05/05/2021   Generalized osteoarthritis 05/05/2021   Hypervitaminosis D 05/05/2021   Hypothyroidism 05/05/2021   Insomnia 05/05/2021   Menopausal symptom 05/05/2021   Primary osteoarthritis, unspecified hand 05/05/2021   Recurrent major depression in remission (HCC) 05/05/2021   Sleep disorder 05/05/2021   Dupuytren's contracture of right hand 08/12/2017   Knee pain 08/25/2013   History of colonic polyps 08/24/2013   Metatarsalgia of left foot 01/09/2012   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/22/2010    PCP: Sari Cable, MD  REFERRING PROVIDER: Leonce Reveal, PA  REFERRING DIAG:  Diagnosis  M54.42,M54.41 (ICD-10-CM) - Acute left-sided low back pain with bilateral sciatica    Rationale for Evaluation and Treatment: Rehabilitation  THERAPY DIAG:  No diagnosis  found.  ONSET DATE: Sciatica for 3 weeks  SUBJECTIVE:                                                                                                                                                                                           SUBJECTIVE STATEMENT: The patient did yard work over the weekend and she can feel it a little.   Eval: Patient has a long history of low back pain.  She reports it began increasing in the spring.  She believes an increase secondary to an increase in doing yard work and gardening.  Approximately 3 weeks ago she began having onset of radicular pain.  The pain follows her posterior thigh and then goes down her  lateral leg into her ankle.  The pain comes and goes.  She has a significant amount of pain when she lies in bed.  She is having difficulty finding a comfortable solution to sleep.  She has had physical therapy in the past with benefit.  PERTINENT HISTORY:  Exercise induced asthma,   PAIN:  Are you having pain? Yes: NPRS scale: 0/10 Pain location: Left lumbar spine into left lower extremity Pain description: Aching Aggravating factors: Lying in bed, bending, extending. Relieving factors: Mild improvement in pain with steroid PRECAUTIONS: None  RED FLAGS: None   WEIGHT BEARING RESTRICTIONS: No  FALLS:  Has patient fallen in last 6 months? No  LIVING ENVIRONMENT: Has a second floor. A couple of steps into the house OCCUPATION: cancer center   PLOF: Independent  PATIENT GOALS:  To have less pain in her lumbar spine and into her leg  NEXT MD VISIT: Nothing at this time  OBJECTIVE:  Note: Objective measures were completed at Evaluation unless otherwise noted.  DIAGNOSTIC FINDINGS:  X-ray reading not back  PATIENT SURVEYS:  Modified Oswestry 18/50 COGNITION: Overall cognitive status: Within functional limits for tasks assessed     SENSATION: Rating pain following a potential L5-S1 dermatome   POSTURE: No significant postural  limitations  PALPATION: Significant spasming in left gluteal and left side lumbar spine  LUMBAR ROM:   AROM eval  Flexion Painful catching at end range  Extension Painful past neutral  Right lateral flexion No pain  Left lateral flexion Painful  Right rotation No pain  Left rotation Painful on the left side   (Blank rows = not tested)  LOWER EXTREMITY ROM:     Passive  Right eval Left eval  Hip flexion WNL WNL  Hip extension    Hip abduction    Hip adduction    Hip internal rotation WNL WNL  Hip external rotation WNL WNL  Knee flexion    Knee extension    Ankle dorsiflexion    Ankle plantarflexion    Ankle inversion    Ankle eversion     (Blank rows = not tested)  LOWER EXTREMITY MMT:    MMT Right eval Left eval  Hip flexion 24.7 16.6  Hip extension    Hip abduction 28.7 26.3  Hip adduction    Hip internal rotation    Hip external rotation    Knee flexion    Knee extension 25.9 33.7  Ankle dorsiflexion    Ankle plantarflexion    Ankle inversion    Ankle eversion     (Blank rows = not tested)    GAIT: No significant gait deviations noted TREATMENT DATE:  8/18 Manual: skilled palpation of trigger points Review of trigger points with patient for self mobilization  Trigger release to gluteal and lumbar spine  Self care:  Reviewed how to split her stance gardening. Reviewed the benefits of a knee pad to kneel. Reviewed importance of taking an dextra step rather than rotating the back.   Neuro-re-ed Squat with cuing for posture and sink to correct posterior movement 3x10  Slow march with UE support to reduce lateral movement 3x10  Hip abduction with UE support 2x10 bilateral Hip extension with UE support 2x10 bilateral   8/13  Manual: skilled palpation of trigger points Review of trigger points with patient for self mobilization  Grade II and III LAD with oscillations   Neuro-re-ed:  March with switch with abdominal brace 2x12 Bridge 3x12   Pallof press 2x12 red  Chop 2x12 red  Each side   8/6   STM bilat QL and lumbar paraspinals; manual desensitization of R glute  Bridge with blue TB 3s 2x10 Curl up hooklying 2s 4x6; with blue TB isometric hold Kneeling plank 5s 10x Side plank 5x each side Standing SLS with hip ABD blue band 10x ea     7/30 Trigger Point Dry Needling  Initial Treatment: Pt instructed on Dry Needling rational, procedures, and possible side effects. Pt instructed to expect mild to moderate muscle soreness later in the day and/or into the next day.  Pt instructed in methods to reduce muscle soreness. Pt instructed to continue prescribed HEP. Because Dry Needling was performed over or adjacent to a lung field, pt was educated on S/S of pneumothorax and to seek immediate medical attention should they occur.  Patient was educated on signs and symptoms of infection and other risk factors and advised to seek medical attention should they occur.  Patient verbalized understanding of these instructions and education.   Patient Verbal Consent Given: Yes Education Handout Provided: Previously Provided Muscles Treated: left lower lumbar paraspinal and left gluteal using a .30x50 needle 3x in paraspinals 2x in gluteal  Electrical Stimulation Performed: No Treatment Response/Outcome: great twitch   Manual: skilled palpation of trigger points Review of trigger points with patient for self mobilization  Grade II and III LAD with oscillations   Neuro-re-ed:  March with switch with abdominal brace 2x10 Bridge 2x12   Cable:  Row 15 pounds 3 x 12 with abdominal breathing Shoulder extension 3 x 12 with abdominal breathing  Reviewed HEP   7/25 Manual:  Trigger Point Dry Needling  Initial Treatment: Pt instructed on Dry Needling rational, procedures, and possible side effects. Pt instructed to expect mild to moderate muscle soreness later in the day and/or into the next day.  Pt instructed in methods  to reduce muscle soreness. Pt instructed to continue prescribed HEP. Because Dry Needling was performed over or adjacent to a lung field, pt was educated on S/S of pneumothorax and to seek immediate medical attention should they occur.  Patient was educated on signs and symptoms of infection and other risk factors and advised to seek medical attention should they occur.  Patient verbalized understanding of these instructions and education.   Patient Verbal Consent Given: Yes Education Handout Provided: Previously Provided Muscles Treated: left lower lumbar paraspinal and left gluteal using a .30x50 needle 3x in paraspinals 2x in gluteal  Electrical Stimulation Performed: No Treatment Response/Outcome: great twitch   Manual: skilled palpation of trigger points Review of trigger points with patient for self mobilization  Grade II and III LAD with oscillations   There-ex:  Reviewed and updated HEP  Gluteal stretch 3x20 sec hold   Neuro-re-ed:  Row red with TA breathing 3x10  Shoulder extension red with TA breathing 3x10     7/16   STM bilat QL and lumbar paraspinals L  and R L1-5 UPA grade III  Cobra with hands out 2x10 Weighted SB 2x10  Postural modifications throughout work day  7/7  STM bilat QL and lumbar paraspinals L L1-5 UPA grade III LAD with PPT 2x12  Exercises - Supine Sciatic Nerve Glide  - 1 x daily - 7 x weekly - 2 sets - 10 reps - 2 hold - Bridge with green  Hip Abduction and Resistance  - 1 x daily - 7 x weekly - 2 sets - 10 reps - DNS Bug Bicycle  - 1 x  daily - 7 x weekly - 2 sets - 20 reps - Single-Leg Anti-Rotation Press With  yellow Anchored Resistance  - 1 x daily - 7 x weekly - 2 sets - 10 reps   6/26                                                                                                                              Manual: Skilled palpation of trigger points Trigger point release to bilateral lumbar spine Review of self trigger point  release  TherEX: Gluteal stretch 3 x 20-second hold Bridge 2 x 10 Supine march 2 x 10 with education on progression Base pool exercises: Hip 3 way Low back stretch  Noodle push down and shoulder extension  Trigger Point Dry Needling  Initial Treatment: Pt instructed on Dry Needling rational, procedures, and possible side effects. Pt instructed to expect mild to moderate muscle soreness later in the day and/or into the next day.  Pt instructed in methods to reduce muscle soreness. Pt instructed to continue prescribed HEP. Because Dry Needling was performed over or adjacent to a lung field, pt was educated on S/S of pneumothorax and to seek immediate medical attention should they occur.  Patient was educated on signs and symptoms of infection and other risk factors and advised to seek medical attention should they occur.  Patient verbalized understanding of these instructions and education.   Patient Verbal Consent Given: Yes Education Handout Provided: Previously Provided Muscles Treated: left lower lumbar paraspinal and left gluteal using a .30x50 needle  Electrical Stimulation Performed: No Treatment Response/Outcome: great twitch       PATIENT EDUCATION:  Education details: HEP, symptom management, centralization of symptoms, benefits and risks of trigger point dry needling. Person educated: Patient Education method: Explanation, Demonstration, Tactile cues, Verbal cues, and Handouts Education comprehension: verbalized understanding, returned demonstration, verbal cues required, tactile cues required, and needs further education  HOME EXERCISE PROGRAM: [id number]   ASSESSMENT:  CLINICAL IMPRESSION:  The patient is making good progress. She continues to have spasming but it is mild. She was able to work outside over the weekend. We reviewed techniques to use to be able to garden with less stress on her back. We reviewed squat technique. We also gave her a standing series  which may be easier for her to do on a regular basis. We discussed how to advance. She will work on her program. If she needs a follow up has will contact PT. We will keep her case open for 6-8 weeks.    OBJECTIVE IMPAIRMENTS: Abnormal gait, decreased activity tolerance, difficulty walking, decreased ROM, decreased strength, increased muscle spasms, and pain.   ACTIVITY LIMITATIONS: carrying, lifting, standing, squatting, sleeping, and locomotion level  PARTICIPATION LIMITATIONS: community activity and yard work  PERSONAL FACTORS: none   REHAB POTENTIAL: good   CLINICAL DECISION MAKING: Stable/uncomplicated  EVALUATION COMPLEXITY: Low   GOALS: Goals reviewed with patient? Yes  SHORT TERM GOALS: Target date:  07/16/2024    Patient will extend past neutral and rotated to the left without increased pain. Baseline: Goal status:  increased pain today 7/25  2.  Patient will report centralization of symptoms into her lower back Baseline:  Goal status: had centralized but now is back   3.  Patient will increase left lower extremity hip flexion by 5 pounds Baseline:  Goal status: INITIAL  4.  Patient will be independent with basic HEP Baseline:  Goal status: INITIAL  08/13/2024   LONG TERM GOALS: Target date: 08/13/2024    Patient will sleep through the night without pain  Baseline:  Goal status: INITIAL  2.  Patient will perform gardening activity without increased pain  Baseline:  Goal status: INITIAL  3.  Patient will demonstrate full ROM of the lumbar spine without increased pain  Baseline:  Goal status: INITIAL   PLAN:  PT FREQUENCY: 2x/week  PT DURATION: 8 weeks  PLANNED INTERVENTIONS: 97110-Therapeutic exercises, 97530- Therapeutic activity, [id number]- Neuromuscular re-education, 97535- Self Care, [id number]- Manual therapy, [id number]- Gait training, [phone]- Aquatic Therapy, [id number]- Electrical stimulation (unattended), [phone]- Ultrasound, Patient/Family education, Stair  training, Taping, Dry Needling, DME instructions, Cryotherapy, and Moist heat .  PLAN FOR NEXT SESSION:  Continue with TPDN and manual therapy. Consider LAD with oscillations. Update and progress exercise program. Consider pool session. Therapy reviewed base    Alm JINNY Don, PT 08/10/2024, 10:06 AM

## 2024-08-14 ENCOUNTER — Other Ambulatory Visit: Payer: Self-pay

## 2024-08-14 ENCOUNTER — Other Ambulatory Visit (HOSPITAL_COMMUNITY): Payer: Self-pay

## 2024-08-18 ENCOUNTER — Other Ambulatory Visit (HOSPITAL_COMMUNITY): Payer: Self-pay

## 2024-08-18 ENCOUNTER — Other Ambulatory Visit: Payer: Self-pay

## 2024-08-24 ENCOUNTER — Other Ambulatory Visit (HOSPITAL_COMMUNITY): Payer: Self-pay

## 2024-08-26 ENCOUNTER — Other Ambulatory Visit: Payer: Self-pay

## 2024-08-26 ENCOUNTER — Other Ambulatory Visit (HOSPITAL_COMMUNITY): Payer: Self-pay

## 2024-08-27 ENCOUNTER — Other Ambulatory Visit (HOSPITAL_COMMUNITY): Payer: Self-pay

## 2024-09-16 ENCOUNTER — Other Ambulatory Visit: Payer: Self-pay

## 2024-09-26 ENCOUNTER — Other Ambulatory Visit (HOSPITAL_COMMUNITY): Payer: Self-pay

## 2024-09-30 ENCOUNTER — Other Ambulatory Visit (HOSPITAL_COMMUNITY): Payer: Self-pay

## 2024-09-30 DIAGNOSIS — E663 Overweight: Secondary | ICD-10-CM | POA: Diagnosis not present

## 2024-09-30 DIAGNOSIS — Z8639 Personal history of other endocrine, nutritional and metabolic disease: Secondary | ICD-10-CM | POA: Diagnosis not present

## 2024-09-30 DIAGNOSIS — Z6826 Body mass index (BMI) 26.0-26.9, adult: Secondary | ICD-10-CM | POA: Diagnosis not present

## 2024-09-30 MED ORDER — ZEPBOUND 15 MG/0.5ML ~~LOC~~ SOAJ
15.0000 mg | SUBCUTANEOUS | 3 refills | Status: AC
  Filled 2024-09-30 – 2024-10-23 (×2): qty 2, 28d supply, fill #0
  Filled 2024-11-24: qty 2, 28d supply, fill #1

## 2024-10-04 ENCOUNTER — Other Ambulatory Visit (HOSPITAL_COMMUNITY): Payer: Self-pay

## 2024-10-20 ENCOUNTER — Other Ambulatory Visit: Payer: Self-pay | Admitting: Medical Genetics

## 2024-10-20 DIAGNOSIS — Z006 Encounter for examination for normal comparison and control in clinical research program: Secondary | ICD-10-CM

## 2024-10-23 ENCOUNTER — Other Ambulatory Visit: Payer: Self-pay

## 2024-10-23 ENCOUNTER — Other Ambulatory Visit (HOSPITAL_COMMUNITY): Payer: Self-pay

## 2024-10-26 ENCOUNTER — Other Ambulatory Visit (HOSPITAL_COMMUNITY): Payer: Self-pay

## 2024-10-26 ENCOUNTER — Encounter: Payer: Self-pay | Admitting: Radiology

## 2024-10-27 ENCOUNTER — Other Ambulatory Visit (HOSPITAL_COMMUNITY): Payer: Self-pay

## 2024-10-28 ENCOUNTER — Other Ambulatory Visit (HOSPITAL_COMMUNITY): Payer: Self-pay

## 2024-10-28 MED ORDER — ETODOLAC 400 MG PO TABS
400.0000 mg | ORAL_TABLET | Freq: Two times a day (BID) | ORAL | 0 refills | Status: AC
  Filled 2024-10-28 – 2024-12-21 (×2): qty 180, 90d supply, fill #0

## 2024-11-02 ENCOUNTER — Other Ambulatory Visit: Payer: Self-pay

## 2024-11-02 ENCOUNTER — Other Ambulatory Visit (HOSPITAL_COMMUNITY): Payer: Self-pay

## 2024-11-02 ENCOUNTER — Other Ambulatory Visit (HOSPITAL_BASED_OUTPATIENT_CLINIC_OR_DEPARTMENT_OTHER): Payer: Self-pay

## 2024-11-02 MED ORDER — ESTRADIOL 0.05 MG/24HR TD PTTW
1.0000 | MEDICATED_PATCH | TRANSDERMAL | 0 refills | Status: AC
  Filled 2024-11-02: qty 24, 84d supply, fill #0

## 2024-11-06 ENCOUNTER — Other Ambulatory Visit: Payer: Self-pay

## 2024-11-06 ENCOUNTER — Other Ambulatory Visit (HOSPITAL_BASED_OUTPATIENT_CLINIC_OR_DEPARTMENT_OTHER): Payer: Self-pay

## 2024-11-11 ENCOUNTER — Other Ambulatory Visit (HOSPITAL_COMMUNITY): Payer: Self-pay

## 2024-11-11 DIAGNOSIS — N951 Menopausal and female climacteric states: Secondary | ICD-10-CM | POA: Diagnosis not present

## 2024-11-11 DIAGNOSIS — Z1151 Encounter for screening for human papillomavirus (HPV): Secondary | ICD-10-CM | POA: Diagnosis not present

## 2024-11-11 DIAGNOSIS — Z1231 Encounter for screening mammogram for malignant neoplasm of breast: Secondary | ICD-10-CM | POA: Diagnosis not present

## 2024-11-11 DIAGNOSIS — R822 Biliuria: Secondary | ICD-10-CM | POA: Diagnosis not present

## 2024-11-11 DIAGNOSIS — Z01419 Encounter for gynecological examination (general) (routine) without abnormal findings: Secondary | ICD-10-CM | POA: Diagnosis not present

## 2024-11-11 DIAGNOSIS — Z6827 Body mass index (BMI) 27.0-27.9, adult: Secondary | ICD-10-CM | POA: Diagnosis not present

## 2024-11-11 DIAGNOSIS — Z124 Encounter for screening for malignant neoplasm of cervix: Secondary | ICD-10-CM | POA: Diagnosis not present

## 2024-11-11 MED ORDER — ESTRADIOL 0.05 MG/24HR TD PTTW
1.0000 | MEDICATED_PATCH | TRANSDERMAL | 3 refills | Status: AC
  Filled 2024-11-11: qty 24, 84d supply, fill #0

## 2024-11-11 MED ORDER — PROGESTERONE MICRONIZED 100 MG PO CAPS
100.0000 mg | ORAL_CAPSULE | Freq: Every day | ORAL | 3 refills | Status: AC
  Filled 2024-11-11 – 2025-01-03 (×2): qty 90, 90d supply, fill #0

## 2024-11-16 ENCOUNTER — Other Ambulatory Visit (HOSPITAL_COMMUNITY): Payer: Self-pay

## 2024-11-16 ENCOUNTER — Other Ambulatory Visit: Payer: Self-pay

## 2024-11-16 MED ORDER — NITROFURANTOIN MONOHYD MACRO 100 MG PO CAPS
100.0000 mg | ORAL_CAPSULE | Freq: Two times a day (BID) | ORAL | 0 refills | Status: AC
  Filled 2024-11-16 (×2): qty 14, 7d supply, fill #0

## 2024-11-25 ENCOUNTER — Other Ambulatory Visit: Payer: Self-pay

## 2024-11-25 ENCOUNTER — Other Ambulatory Visit (HOSPITAL_COMMUNITY): Payer: Self-pay

## 2024-12-02 DIAGNOSIS — E039 Hypothyroidism, unspecified: Secondary | ICD-10-CM | POA: Diagnosis not present

## 2024-12-02 DIAGNOSIS — Z79899 Other long term (current) drug therapy: Secondary | ICD-10-CM | POA: Diagnosis not present

## 2024-12-09 ENCOUNTER — Other Ambulatory Visit (HOSPITAL_COMMUNITY): Payer: Self-pay

## 2024-12-09 ENCOUNTER — Other Ambulatory Visit: Payer: Self-pay

## 2024-12-09 DIAGNOSIS — E663 Overweight: Secondary | ICD-10-CM | POA: Diagnosis not present

## 2024-12-09 DIAGNOSIS — Z79899 Other long term (current) drug therapy: Secondary | ICD-10-CM | POA: Diagnosis not present

## 2024-12-09 DIAGNOSIS — Z6826 Body mass index (BMI) 26.0-26.9, adult: Secondary | ICD-10-CM | POA: Diagnosis not present

## 2024-12-09 DIAGNOSIS — M159 Polyosteoarthritis, unspecified: Secondary | ICD-10-CM | POA: Diagnosis not present

## 2024-12-09 DIAGNOSIS — J309 Allergic rhinitis, unspecified: Secondary | ICD-10-CM | POA: Diagnosis not present

## 2024-12-09 DIAGNOSIS — E039 Hypothyroidism, unspecified: Secondary | ICD-10-CM | POA: Diagnosis not present

## 2024-12-09 DIAGNOSIS — L309 Dermatitis, unspecified: Secondary | ICD-10-CM | POA: Diagnosis not present

## 2024-12-09 DIAGNOSIS — F419 Anxiety disorder, unspecified: Secondary | ICD-10-CM | POA: Diagnosis not present

## 2024-12-09 DIAGNOSIS — G4733 Obstructive sleep apnea (adult) (pediatric): Secondary | ICD-10-CM | POA: Diagnosis not present

## 2024-12-09 DIAGNOSIS — G479 Sleep disorder, unspecified: Secondary | ICD-10-CM | POA: Diagnosis not present

## 2024-12-09 MED ORDER — LEVOTHYROXINE SODIUM 125 MCG PO TABS
125.0000 ug | ORAL_TABLET | Freq: Every morning | ORAL | 1 refills | Status: AC
  Filled 2024-12-09 – 2024-12-21 (×2): qty 90, 90d supply, fill #0

## 2024-12-09 MED ORDER — ETODOLAC 400 MG PO TABS
400.0000 mg | ORAL_TABLET | Freq: Two times a day (BID) | ORAL | 1 refills | Status: AC
  Filled 2024-12-09: qty 180, 90d supply, fill #0

## 2024-12-09 MED ORDER — LEVOCETIRIZINE DIHYDROCHLORIDE 5 MG PO TABS
5.0000 mg | ORAL_TABLET | Freq: Every evening | ORAL | 1 refills | Status: AC
  Filled 2024-12-09: qty 90, 90d supply, fill #0

## 2024-12-09 MED ORDER — ZALEPLON 10 MG PO CAPS
10.0000 mg | ORAL_CAPSULE | Freq: Every evening | ORAL | 1 refills | Status: AC | PRN
  Filled 2024-12-09: qty 30, 30d supply, fill #0

## 2024-12-14 ENCOUNTER — Other Ambulatory Visit (HOSPITAL_COMMUNITY): Payer: Self-pay

## 2024-12-15 ENCOUNTER — Other Ambulatory Visit (HOSPITAL_COMMUNITY): Payer: Self-pay

## 2024-12-16 ENCOUNTER — Other Ambulatory Visit (HOSPITAL_COMMUNITY): Payer: Self-pay

## 2024-12-21 ENCOUNTER — Other Ambulatory Visit: Payer: Self-pay

## 2025-01-04 ENCOUNTER — Other Ambulatory Visit: Payer: Self-pay

## 2025-01-04 ENCOUNTER — Other Ambulatory Visit (HOSPITAL_COMMUNITY): Payer: Self-pay

## 2025-01-20 ENCOUNTER — Other Ambulatory Visit (HOSPITAL_COMMUNITY): Payer: Self-pay
